# Patient Record
Sex: Female | Born: 1985 | Race: White | Hispanic: No | Marital: Married | Smoking: Never smoker
Health system: Southern US, Community
[De-identification: ages and names within clinical notes are randomized; demographics above are authoritative.]

## PROBLEM LIST (undated history)

## (undated) DIAGNOSIS — R519 Headache, unspecified: Secondary | ICD-10-CM

## (undated) DIAGNOSIS — K589 Irritable bowel syndrome without diarrhea: Secondary | ICD-10-CM

## (undated) DIAGNOSIS — U071 COVID-19: Secondary | ICD-10-CM

## (undated) DIAGNOSIS — F419 Anxiety disorder, unspecified: Secondary | ICD-10-CM

## (undated) DIAGNOSIS — K219 Gastro-esophageal reflux disease without esophagitis: Secondary | ICD-10-CM

## (undated) HISTORY — PX: LAPAROSCOPIC OVARIAN CYSTECTOMY: SUR786

---

## 2007-05-17 ENCOUNTER — Ambulatory Visit (HOSPITAL_COMMUNITY): Admission: RE | Admit: 2007-05-17 | Discharge: 2007-05-17 | Payer: Self-pay | Admitting: Obstetrics and Gynecology

## 2007-05-18 ENCOUNTER — Encounter (INDEPENDENT_AMBULATORY_CARE_PROVIDER_SITE_OTHER): Payer: Self-pay | Admitting: Obstetrics and Gynecology

## 2007-08-02 ENCOUNTER — Encounter: Admission: RE | Admit: 2007-08-02 | Discharge: 2007-08-02 | Payer: Self-pay | Admitting: Family Medicine

## 2009-05-20 ENCOUNTER — Ambulatory Visit (HOSPITAL_COMMUNITY): Admission: RE | Admit: 2009-05-20 | Discharge: 2009-05-20 | Payer: Self-pay | Admitting: Obstetrics and Gynecology

## 2009-10-01 ENCOUNTER — Encounter: Admission: RE | Admit: 2009-10-01 | Discharge: 2009-10-01 | Payer: Self-pay | Admitting: Gastroenterology

## 2011-03-10 NOTE — Op Note (Signed)
NAME:  Megan Sanchez, Megan Sanchez           ACCOUNT NO.:  [id number]   MEDICAL RECORD NO.:  [id number]          PATIENT TYPE:  AMB   LOCATION:  SDC                           FACILITY:  [facility]   PHYSICIAN:  Randye Lobo, M.D.   DATE OF BIRTH:  23-Feb-1986   DATE OF PROCEDURE:  05/17/2007  DATE OF DISCHARGE:                               OPERATIVE REPORT   PREOPERATIVE DIAGNOSIS:  Right ovarian cyst, chronic low back pain.   POSTOPERATIVE DIAGNOSIS:  Right ovarian cyst, chronic low back pain.   PROCEDURE:  The laparoscopic right ovarian cystectomy.   SURGEON:  Conley Simmonds, M.D.   ASSISTANT:  Miguel Aschoff, M.D.   ANESTHESIA:  Is general endotracheal.   IV FLUIDS:  500 mL Ringer's lactate.   ESTIMATED BLOOD LOSS:  Minimal.   URINE OUTPUT:  50 mL   COMPLICATIONS:  None.   INDICATIONS FOR PROCEDURE:  The patient is a 25 year old gravida 31  Caucasian female who presented to Urgent Care for evaluation of low back  pain.  The patient had a CT scan which documented two right ovarian  cysts measuring 2.5 and 2.3 cm in February 2008.  The patient was  subsequently seen in the gynecology office for evaluation and she was  noted to have a tender right adnexal mass consistent with a simple right  ovarian cyst on ultrasound.  The patient was followed over time and had  persistence and increase in size of simple right ovarian cyst, which  measured 3.6 cm at the time of surgery.  The patient had been on oral  contraceptive therapy and had no resolution of the ovarian cyst.  A plan  is made now to proceed with surgical treatment of the right ovarian cyst  and evaluation of the patient's chronic low back pain to rule out  endometriosis or adhesive disease.  Risks, benefits, and alternatives  have been reviewed with the patient who wishes to proceed.   FINDINGS:  Laparoscopy demonstrated a 3 cm serous filled right ovarian  cyst.  The left ovary, bilateral fallopian tubes and uterus were  unremarkable.   The appendix, liver, stomach organ, and a large and small  intestine appeared to be unremarkable.  There was no evidence of any  adhesive disease nor endometriosis in the abdomen or the pelvis.   SPECIMENS:  The right ovarian cyst wall was sent to pathology.   PROCEDURE:  The patient was reidentified in the preoperative hold area.  She did receive Ancef 1 gram IV for antibiotic prophylaxis.  The patient  was brought to the operating room where general endotracheal anesthesia  was induced.  She was placed in the dorsal lithotomy position.  The  abdomen and the vagina were sterilely prepped and a Foley catheter was  sterilely placed inside the bladder.  A Hulka tenaculum was placed on  the anterior cervical lip.   The laparoscopic portion of the procedure began by creating a  curvilinear incision in the umbilicus.  The incision was carried down to  the fascia using a Kelly clamp.  A 10-mm trocar was inserted directly  into the peritoneal cavity and  the laparoscope confirmed proper  placement.  The CO2 pneumoperitoneum was achieved.   The patient was placed in the Trendelenburg position.  A 5 mm incision  was created in each the right and left lower quadrants.  5 mm trocars  were placed under direct visualization of the laparoscope.  An  inspection of the pelvic and abdominal organs was performed and findings  are as noted above.   The procedure began by a using monopolar energy to create a linear  incision over the right ovarian cortex.  During the course of this  dissection the cyst was entered and clear yellow fluid was extruded from  the ovarian cyst.  The ovarian cyst which had been entered was now  dissected off from the surrounding ovarian cortex using a grasper  forceps.  This cyst was actually quite adherent to the ovarian cortex  and had to be removed in pieces.  The cyst was then irrigated and  suctioned and the base of the ovarian cyst was cauterized to ablate any   remaining cyst wall which was very difficult to remove.  Hemostasis was  noted to be good.   Intercede was then placed around the right ovary.  Hemostasis was  confirmed again.   The 5 mm trocars were removed under direct visualization of the  laparoscope.  A 10-mm trocar in the umbilicus and the laparoscope were  removed simultaneously.   The umbilical incision was closed with a subcuticular suture of 4-0  Vicryl.  This was covered by sterile bandage.  The 5-mm incisions were  closed with Dermabond.   The vaginal instruments and a Foley catheter were removed.  This  concluded the patient's procedure.  There were no complications.  All  needle, instrument, sponge counts were correct.      Randye Lobo, M.D.  Electronically Signed     BES/MEDQ  D:  05/17/2007  T:  05/17/2007  Job:  [id number]

## 2011-08-10 LAB — PREGNANCY, URINE: Preg Test, Ur: NEGATIVE

## 2011-08-10 LAB — BASIC METABOLIC PANEL
BUN: 7
CO2: 27
Chloride: 104
Creatinine, Ser: 0.77
GFR calc Af Amer: >60
Glucose, Bld: 91
Potassium: 4.2
Sodium: 136

## 2011-08-10 LAB — CBC
HCT: 41.5
Hemoglobin: 14.2
Platelets: 263

## 2012-10-27 ENCOUNTER — Other Ambulatory Visit: Payer: Self-pay | Admitting: Orthopedic Surgery

## 2012-10-27 DIAGNOSIS — M542 Cervicalgia: Secondary | ICD-10-CM

## 2012-10-28 ENCOUNTER — Ambulatory Visit
Admission: RE | Admit: 2012-10-28 | Discharge: 2012-10-28 | Disposition: A | Discharge status: Home / Self Care | Payer: BC Managed Care – PPO | Source: Ambulatory Visit | Attending: Orthopedic Surgery | Admitting: Orthopedic Surgery

## 2012-10-28 ENCOUNTER — Other Ambulatory Visit: Payer: Self-pay

## 2012-10-28 DIAGNOSIS — M542 Cervicalgia: Secondary | ICD-10-CM

## 2018-02-03 DIAGNOSIS — K589 Irritable bowel syndrome without diarrhea: Secondary | ICD-10-CM | POA: Insufficient documentation

## 2018-12-23 MED FILL — LARIN 21 1-20 TABLET: 1-20 | 42 days supply | Qty: 42 | Fill #0

## 2018-12-26 MED FILL — SPIRONOLACTONE 100 MG TAB: 100 | 30 days supply | Qty: 30 | Fill #0 | Status: TO

## 2019-01-16 MED FILL — SPIRONOLACTONE 100 MG TAB: 100 | 30 days supply | Qty: 30 | Fill #0

## 2019-01-18 MED FILL — CYCLOBENZAPRINE HCL 5 MG TA: 5 | 10 days supply | Qty: 30 | Fill #0

## 2019-01-19 MED FILL — NORETHIND-ETH ESTRAD 1-0.02: 1-20 | 84 days supply | Qty: 84 | Fill #0

## 2019-03-02 MED FILL — SPIRONOLACTONE 100 MG TAB: 100 | 30 days supply | Qty: 30 | Fill #1

## 2019-04-17 MED FILL — SPIRONOLACTONE 100 MG TAB: 100 | 30 days supply | Qty: 30 | Fill #2

## 2019-04-17 MED FILL — NORETHIND-ETH ESTRAD 1-0.02: 1-20 | 84 days supply | Qty: 84 | Fill #1

## 2019-05-04 ENCOUNTER — Encounter (HOSPITAL_COMMUNITY): Payer: Self-pay | Admitting: Emergency Medicine

## 2019-05-04 ENCOUNTER — Other Ambulatory Visit: Payer: Self-pay

## 2019-05-04 ENCOUNTER — Ambulatory Visit (HOSPITAL_COMMUNITY)
Admission: EM | Admit: 2019-05-04 | Discharge: 2019-05-04 | Disposition: A | Discharge status: Home / Self Care | Payer: 59 | Attending: Emergency Medicine | Admitting: Emergency Medicine

## 2019-05-04 DIAGNOSIS — B3731 Acute candidiasis of vulva and vagina: Secondary | ICD-10-CM

## 2019-05-04 DIAGNOSIS — Z3202 Encounter for pregnancy test, result negative: Secondary | ICD-10-CM

## 2019-05-04 DIAGNOSIS — B373 Candidiasis of vulva and vagina: Secondary | ICD-10-CM | POA: Diagnosis not present

## 2019-05-04 LAB — POCT URINALYSIS DIP (DEVICE)
Bilirubin Urine: NEGATIVE
Glucose, UA: NEGATIVE mg/dL
Ketones, ur: NEGATIVE mg/dL
Nitrite: NEGATIVE
Protein, ur: NEGATIVE mg/dL
Specific Gravity, Urine: 1.015 (ref 1.005–1.030)
Urobilinogen, UA: 0.2 mg/dL (ref 0.0–1.0)
pH: 7 (ref 5.0–8.0)

## 2019-05-04 LAB — POCT PREGNANCY, URINE: Preg Test, Ur: NEGATIVE

## 2019-05-04 MED ORDER — FLUCONAZOLE 200 MG PO TABS: 200.0000 mg | ORAL_TABLET | Freq: Once | ORAL | 0 refills | Status: AC

## 2019-05-04 MED FILL — FLUCONAZOLE 200 MG TABLET: 200 | 3 days supply | Qty: 2 | Fill #0

## 2019-05-04 NOTE — Discharge Instructions (Signed)
Take Diflucan today, may repeat in 3 days if still having symptoms. This may take up to 1 week to reach maximum efficacy. Turn if you develop worsening discharge, vaginal or pelvic pain, fever, abdominal pain, difficulty urinating, blood in your urine.
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
## 2019-05-04 NOTE — ED Triage Notes (Signed)
Pt sts dysuria with vaginal irritation; pt sts taking septra without relief

## 2019-05-05 LAB — CERVICOVAGINAL ANCILLARY ONLY
Bacterial vaginitis: NEGATIVE
Chlamydia: NEGATIVE
Neisseria Gonorrhea: NEGATIVE
Trichomonas: NEGATIVE

## 2019-05-05 LAB — URINE CULTURE: Culture: NO GROWTH

## 2019-06-20 ENCOUNTER — Other Ambulatory Visit (HOSPITAL_COMMUNITY)
Admission: RE | Admit: 2019-06-20 | Discharge: 2019-06-20 | Disposition: A | Discharge status: Home / Self Care | Payer: 59 | Source: Ambulatory Visit | Attending: Obstetrics and Gynecology | Admitting: Obstetrics and Gynecology

## 2019-06-20 ENCOUNTER — Other Ambulatory Visit: Payer: Self-pay | Admitting: Obstetrics and Gynecology

## 2019-06-20 DIAGNOSIS — Z309 Encounter for contraceptive management, unspecified: Secondary | ICD-10-CM | POA: Diagnosis not present

## 2019-06-20 DIAGNOSIS — N898 Other specified noninflammatory disorders of vagina: Secondary | ICD-10-CM | POA: Diagnosis not present

## 2019-06-20 DIAGNOSIS — Z124 Encounter for screening for malignant neoplasm of cervix: Secondary | ICD-10-CM | POA: Insufficient documentation

## 2019-06-20 DIAGNOSIS — Z01419 Encounter for gynecological examination (general) (routine) without abnormal findings: Secondary | ICD-10-CM | POA: Diagnosis not present

## 2019-06-20 DIAGNOSIS — N760 Acute vaginitis: Secondary | ICD-10-CM | POA: Diagnosis not present

## 2019-06-20 DIAGNOSIS — L709 Acne, unspecified: Secondary | ICD-10-CM | POA: Diagnosis not present

## 2019-06-20 DIAGNOSIS — Z79899 Other long term (current) drug therapy: Secondary | ICD-10-CM | POA: Diagnosis not present

## 2019-06-20 MED FILL — SPIRONOLACTONE 100 MG TAB: 100 | 30 days supply | Qty: 30 | Fill #0

## 2019-06-20 MED FILL — LARIN FE 1.5-30 TABLET: 1.5-30 | 84 days supply | Qty: 84 | Fill #0

## 2019-06-23 LAB — CYTOLOGY - PAP
HPV 16/18/45 genotyping: NEGATIVE
HPV: DETECTED — AB

## 2019-06-28 ENCOUNTER — Other Ambulatory Visit: Payer: Self-pay | Admitting: Obstetrics and Gynecology

## 2019-06-28 DIAGNOSIS — R87612 Low grade squamous intraepithelial lesion on cytologic smear of cervix (LGSIL): Secondary | ICD-10-CM | POA: Diagnosis not present

## 2019-06-28 DIAGNOSIS — Z0001 Encounter for general adult medical examination with abnormal findings: Secondary | ICD-10-CM | POA: Diagnosis not present

## 2019-06-28 DIAGNOSIS — R87810 Cervical high risk human papillomavirus (HPV) DNA test positive: Secondary | ICD-10-CM | POA: Diagnosis not present

## 2019-06-28 DIAGNOSIS — N870 Mild cervical dysplasia: Secondary | ICD-10-CM | POA: Diagnosis not present

## 2019-06-28 DIAGNOSIS — M542 Cervicalgia: Secondary | ICD-10-CM | POA: Diagnosis not present

## 2019-06-28 DIAGNOSIS — L700 Acne vulgaris: Secondary | ICD-10-CM | POA: Diagnosis not present

## 2019-06-28 DIAGNOSIS — J3489 Other specified disorders of nose and nasal sinuses: Secondary | ICD-10-CM | POA: Diagnosis not present

## 2019-06-28 DIAGNOSIS — Z3202 Encounter for pregnancy test, result negative: Secondary | ICD-10-CM | POA: Diagnosis not present

## 2019-06-28 MED FILL — MUPIROCIN 2% CREAM: 2 | 10 days supply | Qty: 30 | Fill #0

## 2019-06-29 MED FILL — CYCLOBENZAPRINE 10 MG TAB: 10 | 20 days supply | Qty: 60 | Fill #0

## 2019-07-05 DIAGNOSIS — Z8742 Personal history of other diseases of the female genital tract: Secondary | ICD-10-CM | POA: Diagnosis not present

## 2019-07-17 MED FILL — SPIRONOLACTONE 100 MG TAB: 100 | 30 days supply | Qty: 30 | Fill #1

## 2019-08-14 MED FILL — SPIRONOLACTONE 100 MG TAB: 100 | 30 days supply | Qty: 30 | Fill #2

## 2019-09-18 MED FILL — SPIRONOLACTONE 100 MG TAB: 100 | 90 days supply | Qty: 90 | Fill #0

## 2019-09-19 DIAGNOSIS — L710 Perioral dermatitis: Secondary | ICD-10-CM | POA: Diagnosis not present

## 2019-09-19 DIAGNOSIS — D1801 Hemangioma of skin and subcutaneous tissue: Secondary | ICD-10-CM | POA: Diagnosis not present

## 2019-09-19 DIAGNOSIS — L700 Acne vulgaris: Secondary | ICD-10-CM | POA: Diagnosis not present

## 2019-09-19 DIAGNOSIS — D2261 Melanocytic nevi of right upper limb, including shoulder: Secondary | ICD-10-CM | POA: Diagnosis not present

## 2019-11-29 MED FILL — SPIRONOLACTONE 100 MG TAB: 100 | 30 days supply | Qty: 30 | Fill #0

## 2020-01-12 MED FILL — LARIN FE 1.5-30 TABLET: 1.5-30 | 84 days supply | Qty: 84 | Fill #2

## 2020-01-12 MED FILL — spIRONOLACTONE 100 MG TAB: 100 | 30 days supply | Qty: 30 | Fill #1

## 2020-01-12 MED FILL — OLOPATADINE HCL 0.2 % SOLN: 0.2 | 25 days supply | Qty: 3 | Fill #0

## 2020-02-12 MED FILL — OLOPATADINE HCL 0.2 % SOLN: 0.2 | 25 days supply | Qty: 3 | Fill #1

## 2020-02-12 MED FILL — SPIRONOLACTONE 100 MG TAB: 100 | 30 days supply | Qty: 30 | Fill #2

## 2020-03-12 MED FILL — SPIRONOLACTONE 100 MG TAB: 100 | 30 days supply | Qty: 30 | Fill #3

## 2020-04-10 MED FILL — LARIN FE 1.5-30 TABLET: 1.5-30 | 84 days supply | Qty: 84 | Fill #3

## 2020-04-10 MED FILL — SPIRONOLACTONE 100 MG TABS: 100 | 30 days supply | Qty: 30 | Fill #4

## 2020-05-15 MED FILL — SPIRONOLACTONE 100 MG TAB: 100 | 30 days supply | Qty: 30 | Fill #5

## 2020-06-10 MED FILL — SPIRONOLACTONE 100 MG TAB: 100 | 30 days supply | Qty: 30 | Fill #6

## 2020-06-21 ENCOUNTER — Other Ambulatory Visit: Payer: Self-pay | Admitting: Obstetrics and Gynecology

## 2020-06-21 ENCOUNTER — Other Ambulatory Visit (HOSPITAL_COMMUNITY)
Admission: RE | Admit: 2020-06-21 | Discharge: 2020-06-21 | Disposition: A | Discharge status: Home / Self Care | Payer: 59 | Source: Ambulatory Visit | Attending: Obstetrics and Gynecology | Admitting: Obstetrics and Gynecology

## 2020-06-21 ENCOUNTER — Other Ambulatory Visit (HOSPITAL_COMMUNITY): Payer: Self-pay | Admitting: Obstetrics and Gynecology

## 2020-06-21 DIAGNOSIS — Z124 Encounter for screening for malignant neoplasm of cervix: Secondary | ICD-10-CM | POA: Diagnosis not present

## 2020-06-21 DIAGNOSIS — Z309 Encounter for contraceptive management, unspecified: Secondary | ICD-10-CM | POA: Diagnosis not present

## 2020-06-21 DIAGNOSIS — N870 Mild cervical dysplasia: Secondary | ICD-10-CM | POA: Diagnosis not present

## 2020-06-21 DIAGNOSIS — Z01411 Encounter for gynecological examination (general) (routine) with abnormal findings: Secondary | ICD-10-CM | POA: Diagnosis not present

## 2020-06-21 DIAGNOSIS — R87810 Cervical high risk human papillomavirus (HPV) DNA test positive: Secondary | ICD-10-CM | POA: Diagnosis not present

## 2020-06-27 LAB — CYTOLOGY - PAP
Comment: NEGATIVE
Diagnosis: UNDETERMINED — AB
High risk HPV: NEGATIVE

## 2020-07-12 MED FILL — SPIRONOLACTONE 100 MG TAB: 100 | 30 days supply | Qty: 30 | Fill #7

## 2020-07-12 MED FILL — LARIN FE 1.5-30 TABLET: 1.5-30 | 84 days supply | Qty: 84 | Fill #0

## 2020-07-20 DIAGNOSIS — J069 Acute upper respiratory infection, unspecified: Secondary | ICD-10-CM | POA: Diagnosis not present

## 2020-07-20 DIAGNOSIS — Z20822 Contact with and (suspected) exposure to covid-19: Secondary | ICD-10-CM | POA: Diagnosis not present

## 2020-08-07 MED FILL — SPIRONOLACTONE 100 MG TAB: 100 | 30 days supply | Qty: 30 | Fill #8

## 2020-08-08 ENCOUNTER — Other Ambulatory Visit: Payer: Self-pay | Admitting: Physician Assistant

## 2020-08-08 DIAGNOSIS — A09 Infectious gastroenteritis and colitis, unspecified: Secondary | ICD-10-CM | POA: Diagnosis not present

## 2020-08-08 DIAGNOSIS — R1011 Right upper quadrant pain: Secondary | ICD-10-CM | POA: Diagnosis not present

## 2020-08-09 ENCOUNTER — Ambulatory Visit
Admission: RE | Admit: 2020-08-09 | Discharge: 2020-08-09 | Disposition: A | Discharge status: Home / Self Care | Payer: 59 | Source: Ambulatory Visit | Attending: Physician Assistant | Admitting: Physician Assistant

## 2020-08-09 DIAGNOSIS — R1011 Right upper quadrant pain: Secondary | ICD-10-CM

## 2020-08-09 DIAGNOSIS — A09 Infectious gastroenteritis and colitis, unspecified: Secondary | ICD-10-CM | POA: Diagnosis not present

## 2020-08-20 ENCOUNTER — Other Ambulatory Visit (HOSPITAL_COMMUNITY): Payer: Self-pay | Admitting: Physician Assistant

## 2020-08-20 MED FILL — DICYCLOMINE 20 MG TABLET: 20 | 30 days supply | Qty: 120 | Fill #0

## 2020-08-28 DIAGNOSIS — L700 Acne vulgaris: Secondary | ICD-10-CM | POA: Diagnosis not present

## 2020-08-28 DIAGNOSIS — M542 Cervicalgia: Secondary | ICD-10-CM | POA: Diagnosis not present

## 2020-08-28 DIAGNOSIS — K589 Irritable bowel syndrome without diarrhea: Secondary | ICD-10-CM | POA: Diagnosis not present

## 2020-08-28 DIAGNOSIS — Z0000 Encounter for general adult medical examination without abnormal findings: Secondary | ICD-10-CM | POA: Diagnosis not present

## 2020-08-28 DIAGNOSIS — Z13220 Encounter for screening for lipoid disorders: Secondary | ICD-10-CM | POA: Diagnosis not present

## 2020-09-04 MED FILL — SPIRONOLACTONE 100 MG TAB: 100 | 30 days supply | Qty: 30 | Fill #9

## 2020-09-12 ENCOUNTER — Other Ambulatory Visit (HOSPITAL_COMMUNITY): Payer: Self-pay | Admitting: Family Medicine

## 2020-09-12 MED FILL — CYCLOBENZAPRINE HCL 10 MG T: 10 | 20 days supply | Qty: 60 | Fill #0

## 2020-09-24 ENCOUNTER — Other Ambulatory Visit (HOSPITAL_COMMUNITY): Payer: Self-pay | Admitting: Dermatology

## 2020-09-24 DIAGNOSIS — D2262 Melanocytic nevi of left upper limb, including shoulder: Secondary | ICD-10-CM | POA: Diagnosis not present

## 2020-09-24 DIAGNOSIS — L819 Disorder of pigmentation, unspecified: Secondary | ICD-10-CM | POA: Diagnosis not present

## 2020-09-24 DIAGNOSIS — D225 Melanocytic nevi of trunk: Secondary | ICD-10-CM | POA: Diagnosis not present

## 2020-09-24 DIAGNOSIS — D2261 Melanocytic nevi of right upper limb, including shoulder: Secondary | ICD-10-CM | POA: Diagnosis not present

## 2020-09-24 DIAGNOSIS — D1801 Hemangioma of skin and subcutaneous tissue: Secondary | ICD-10-CM | POA: Diagnosis not present

## 2020-09-24 DIAGNOSIS — D2271 Melanocytic nevi of right lower limb, including hip: Secondary | ICD-10-CM | POA: Diagnosis not present

## 2020-09-24 DIAGNOSIS — L700 Acne vulgaris: Secondary | ICD-10-CM | POA: Diagnosis not present

## 2020-09-24 DIAGNOSIS — D2272 Melanocytic nevi of left lower limb, including hip: Secondary | ICD-10-CM | POA: Diagnosis not present

## 2020-10-08 ENCOUNTER — Other Ambulatory Visit (HOSPITAL_COMMUNITY): Payer: Self-pay | Admitting: Physician Assistant

## 2020-10-08 DIAGNOSIS — H6503 Acute serous otitis media, bilateral: Secondary | ICD-10-CM | POA: Insufficient documentation

## 2020-10-08 DIAGNOSIS — J310 Chronic rhinitis: Secondary | ICD-10-CM | POA: Insufficient documentation

## 2020-10-08 DIAGNOSIS — R1011 Right upper quadrant pain: Secondary | ICD-10-CM | POA: Diagnosis not present

## 2020-10-08 DIAGNOSIS — K589 Irritable bowel syndrome without diarrhea: Secondary | ICD-10-CM | POA: Diagnosis not present

## 2020-10-08 DIAGNOSIS — H6983 Other specified disorders of Eustachian tube, bilateral: Secondary | ICD-10-CM | POA: Diagnosis not present

## 2020-10-08 DIAGNOSIS — R110 Nausea: Secondary | ICD-10-CM | POA: Diagnosis not present

## 2020-10-08 DIAGNOSIS — H6993 Unspecified Eustachian tube disorder, bilateral: Secondary | ICD-10-CM | POA: Insufficient documentation

## 2020-10-08 MED FILL — FLUTICASONE PROP 50 MCG SPR: 50 | 30 days supply | Qty: 16 | Fill #0

## 2020-10-08 MED FILL — SPIRONOLACTONE 100 MG TAB: 100 | 30 days supply | Qty: 30 | Fill #0

## 2020-10-08 MED FILL — LARIN FE 1.5-30 TABLET: 1.5-30 | 84 days supply | Qty: 84 | Fill #1

## 2020-10-08 MED FILL — ONDANSETRON HCL 4 MG TABLET: 4 | 14 days supply | Qty: 42 | Fill #0

## 2020-10-09 ENCOUNTER — Other Ambulatory Visit (HOSPITAL_COMMUNITY): Payer: Self-pay | Admitting: Family Medicine

## 2020-10-09 MED FILL — FLUCONAZOLE 150 MG TABS: 150 | 1 days supply | Qty: 1 | Fill #0

## 2020-11-03 DIAGNOSIS — R0981 Nasal congestion: Secondary | ICD-10-CM | POA: Diagnosis not present

## 2020-11-03 DIAGNOSIS — Z20822 Contact with and (suspected) exposure to covid-19: Secondary | ICD-10-CM | POA: Diagnosis not present

## 2020-11-03 DIAGNOSIS — J40 Bronchitis, not specified as acute or chronic: Secondary | ICD-10-CM | POA: Diagnosis not present

## 2020-11-03 DIAGNOSIS — R059 Cough, unspecified: Secondary | ICD-10-CM | POA: Diagnosis not present

## 2020-11-08 MED FILL — CYCLOBENZAPRINE HCL 10 MG T: 10 | 20 days supply | Qty: 60 | Fill #1

## 2020-11-08 MED FILL — ONDANSETRON HCL 4 MG TABLET: 4 | 14 days supply | Qty: 42 | Fill #1

## 2020-11-08 MED FILL — FLUTICASONE PROP 50 MCG SPR: 50 | 30 days supply | Qty: 16 | Fill #1

## 2020-11-08 MED FILL — spIRONOLACTONE 100 MG TAB: 100 | 30 days supply | Qty: 30 | Fill #1

## 2020-12-05 MED FILL — spIRONOLACTONE 100 MG TAB: 100 | 30 days supply | Qty: 30 | Fill #2

## 2020-12-18 ENCOUNTER — Other Ambulatory Visit: Payer: Self-pay | Admitting: Gastroenterology

## 2020-12-18 ENCOUNTER — Other Ambulatory Visit (HOSPITAL_COMMUNITY): Payer: Self-pay | Admitting: Gastroenterology

## 2020-12-18 DIAGNOSIS — R1011 Right upper quadrant pain: Secondary | ICD-10-CM | POA: Diagnosis not present

## 2020-12-18 DIAGNOSIS — R197 Diarrhea, unspecified: Secondary | ICD-10-CM | POA: Diagnosis not present

## 2020-12-18 DIAGNOSIS — R112 Nausea with vomiting, unspecified: Secondary | ICD-10-CM | POA: Diagnosis not present

## 2020-12-18 DIAGNOSIS — K580 Irritable bowel syndrome with diarrhea: Secondary | ICD-10-CM | POA: Diagnosis not present

## 2021-01-02 MED FILL — LARIN FE 1.5-30 TABLET: 1.5-30 | 84 days supply | Qty: 84 | Fill #2

## 2021-01-02 MED FILL — SPIRONOLACTONE 100 MG TAB: 100 | 30 days supply | Qty: 30 | Fill #3

## 2021-01-07 ENCOUNTER — Other Ambulatory Visit (HOSPITAL_COMMUNITY): Payer: Self-pay | Admitting: Physician Assistant

## 2021-01-07 DIAGNOSIS — R519 Headache, unspecified: Secondary | ICD-10-CM | POA: Diagnosis not present

## 2021-01-07 DIAGNOSIS — F419 Anxiety disorder, unspecified: Secondary | ICD-10-CM | POA: Diagnosis not present

## 2021-01-09 ENCOUNTER — Other Ambulatory Visit: Payer: Self-pay

## 2021-01-09 ENCOUNTER — Ambulatory Visit (HOSPITAL_COMMUNITY)
Admission: RE | Admit: 2021-01-09 | Discharge: 2021-01-09 | Disposition: A | Discharge status: Home / Self Care | Payer: 59 | Source: Ambulatory Visit | Attending: Gastroenterology | Admitting: Gastroenterology

## 2021-01-09 DIAGNOSIS — R109 Unspecified abdominal pain: Secondary | ICD-10-CM | POA: Diagnosis not present

## 2021-01-09 DIAGNOSIS — R1011 Right upper quadrant pain: Secondary | ICD-10-CM | POA: Insufficient documentation

## 2021-01-09 MED ORDER — TECHNETIUM TC 99M MEBROFENIN IV KIT
5.2000 | PACK | Freq: Once | INTRAVENOUS | Status: AC | PRN
  Administered 2021-01-09: 5.2 via INTRAVENOUS

## 2021-01-13 DIAGNOSIS — E663 Overweight: Secondary | ICD-10-CM | POA: Diagnosis not present

## 2021-01-13 DIAGNOSIS — L658 Other specified nonscarring hair loss: Secondary | ICD-10-CM | POA: Diagnosis not present

## 2021-01-13 DIAGNOSIS — R5382 Chronic fatigue, unspecified: Secondary | ICD-10-CM | POA: Diagnosis not present

## 2021-01-13 DIAGNOSIS — N926 Irregular menstruation, unspecified: Secondary | ICD-10-CM | POA: Diagnosis not present

## 2021-01-13 DIAGNOSIS — E6609 Other obesity due to excess calories: Secondary | ICD-10-CM | POA: Diagnosis not present

## 2021-01-28 ENCOUNTER — Other Ambulatory Visit (HOSPITAL_COMMUNITY): Payer: Self-pay

## 2021-01-28 MED ORDER — PEG 3350-KCL-NA BICARB-NACL 420 G PO SOLR
4000.0000 mL | Freq: Once | ORAL | 0 refills | Status: AC
  Filled 2021-01-28: qty 4000, 1d supply, fill #0

## 2021-01-29 ENCOUNTER — Other Ambulatory Visit (HOSPITAL_COMMUNITY): Payer: Self-pay

## 2021-02-05 ENCOUNTER — Other Ambulatory Visit (HOSPITAL_COMMUNITY): Payer: Self-pay

## 2021-02-05 DIAGNOSIS — L292 Pruritus vulvae: Secondary | ICD-10-CM | POA: Diagnosis not present

## 2021-02-05 DIAGNOSIS — R3989 Other symptoms and signs involving the genitourinary system: Secondary | ICD-10-CM | POA: Diagnosis not present

## 2021-02-05 MED ORDER — FLUCONAZOLE 150 MG PO TABS
150.0000 mg | ORAL_TABLET | Freq: Every day | ORAL | 0 refills | Status: DC
  Filled 2021-02-05: qty 1, 1d supply, fill #0

## 2021-02-05 MED FILL — Cyclobenzaprine HCl Tab 10 MG: ORAL | 20 days supply | Qty: 60 | Fill #0 | Status: AC

## 2021-02-05 MED FILL — Spironolactone Tab 100 MG: ORAL | 30 days supply | Qty: 30 | Fill #0 | Status: AC

## 2021-02-11 ENCOUNTER — Other Ambulatory Visit (HOSPITAL_COMMUNITY): Payer: Self-pay

## 2021-02-11 MED ORDER — FLUCONAZOLE 150 MG PO TABS
150.0000 mg | ORAL_TABLET | ORAL | 0 refills | Status: DC
  Filled 2021-02-11: qty 1, 1d supply, fill #0

## 2021-03-04 DIAGNOSIS — R350 Frequency of micturition: Secondary | ICD-10-CM | POA: Diagnosis not present

## 2021-03-04 DIAGNOSIS — N898 Other specified noninflammatory disorders of vagina: Secondary | ICD-10-CM | POA: Diagnosis not present

## 2021-03-25 DIAGNOSIS — R197 Diarrhea, unspecified: Secondary | ICD-10-CM | POA: Diagnosis not present

## 2021-03-25 DIAGNOSIS — D128 Benign neoplasm of rectum: Secondary | ICD-10-CM | POA: Diagnosis not present

## 2021-03-25 DIAGNOSIS — K5289 Other specified noninfective gastroenteritis and colitis: Secondary | ICD-10-CM | POA: Diagnosis not present

## 2021-03-25 DIAGNOSIS — K640 First degree hemorrhoids: Secondary | ICD-10-CM | POA: Diagnosis not present

## 2021-03-27 ENCOUNTER — Other Ambulatory Visit (HOSPITAL_COMMUNITY): Payer: Self-pay

## 2021-03-27 MED FILL — Spironolactone Tab 100 MG: ORAL | 30 days supply | Qty: 30 | Fill #1 | Status: AC

## 2021-03-27 MED FILL — Norethindrone Ace & Ethinyl Estradiol-FE Tab 1.5 MG-30 MCG: ORAL | 84 days supply | Qty: 84 | Fill #0 | Status: AC

## 2021-04-23 ENCOUNTER — Other Ambulatory Visit (HOSPITAL_COMMUNITY): Payer: Self-pay

## 2021-04-23 DIAGNOSIS — G43909 Migraine, unspecified, not intractable, without status migrainosus: Secondary | ICD-10-CM | POA: Insufficient documentation

## 2021-04-23 DIAGNOSIS — F419 Anxiety disorder, unspecified: Secondary | ICD-10-CM | POA: Insufficient documentation

## 2021-04-23 DIAGNOSIS — R519 Headache, unspecified: Secondary | ICD-10-CM | POA: Insufficient documentation

## 2021-04-23 MED ORDER — SULFAMETHOXAZOLE-TRIMETHOPRIM 800-160 MG PO TABS
1.0000 | ORAL_TABLET | Freq: Two times a day (BID) | ORAL | 0 refills | Status: DC
  Filled 2021-04-23: qty 20, 10d supply, fill #0

## 2021-05-14 ENCOUNTER — Other Ambulatory Visit (HOSPITAL_COMMUNITY): Payer: Self-pay

## 2021-05-14 MED FILL — Spironolactone Tab 100 MG: ORAL | 11 days supply | Qty: 11 | Fill #2 | Status: AC

## 2021-05-14 MED FILL — Spironolactone Tab 100 MG: ORAL | 19 days supply | Qty: 19 | Fill #2 | Status: AC

## 2021-06-12 ENCOUNTER — Other Ambulatory Visit (HOSPITAL_COMMUNITY): Payer: Self-pay

## 2021-06-12 MED FILL — Norethindrone Ace & Ethinyl Estradiol-FE Tab 1.5 MG-30 MCG: ORAL | 84 days supply | Qty: 84 | Fill #1 | Status: AC

## 2021-06-12 MED FILL — Spironolactone Tab 100 MG: ORAL | 30 days supply | Qty: 30 | Fill #3 | Status: AC

## 2021-07-21 ENCOUNTER — Other Ambulatory Visit (HOSPITAL_COMMUNITY): Payer: Self-pay

## 2021-07-21 MED FILL — Spironolactone Tab 100 MG: ORAL | 30 days supply | Qty: 30 | Fill #4 | Status: AC

## 2021-07-22 ENCOUNTER — Other Ambulatory Visit (HOSPITAL_COMMUNITY): Payer: Self-pay

## 2021-07-22 DIAGNOSIS — Z01419 Encounter for gynecological examination (general) (routine) without abnormal findings: Secondary | ICD-10-CM | POA: Diagnosis not present

## 2021-07-22 DIAGNOSIS — Z124 Encounter for screening for malignant neoplasm of cervix: Secondary | ICD-10-CM | POA: Diagnosis not present

## 2021-07-22 DIAGNOSIS — Z309 Encounter for contraceptive management, unspecified: Secondary | ICD-10-CM | POA: Diagnosis not present

## 2021-07-22 DIAGNOSIS — D219 Benign neoplasm of connective and other soft tissue, unspecified: Secondary | ICD-10-CM | POA: Diagnosis not present

## 2021-07-22 MED ORDER — NORETHIN ACE-ETH ESTRAD-FE 1.5-30 MG-MCG PO TABS
1.0000 | ORAL_TABLET | Freq: Every day | ORAL | 4 refills | Status: DC
  Filled 2021-07-22 – 2021-09-23 (×4): qty 84, 84d supply, fill #0
  Filled 2021-12-17: qty 84, 84d supply, fill #1
  Filled 2022-03-10: qty 84, 84d supply, fill #2
  Filled 2022-05-29: qty 84, 84d supply, fill #3
  Filled 2022-07-20 – 2022-07-22 (×2): qty 84, 84d supply, fill #4

## 2021-07-24 ENCOUNTER — Ambulatory Visit (HOSPITAL_COMMUNITY)
Admission: EM | Admit: 2021-07-24 | Discharge: 2021-07-24 | Disposition: A | Discharge status: Home / Self Care | Payer: 59 | Attending: Student | Admitting: Student

## 2021-07-24 ENCOUNTER — Other Ambulatory Visit (HOSPITAL_COMMUNITY): Payer: Self-pay

## 2021-07-24 ENCOUNTER — Encounter (HOSPITAL_COMMUNITY): Payer: Self-pay | Admitting: Emergency Medicine

## 2021-07-24 ENCOUNTER — Other Ambulatory Visit: Payer: Self-pay

## 2021-07-24 DIAGNOSIS — S161XXA Strain of muscle, fascia and tendon at neck level, initial encounter: Secondary | ICD-10-CM | POA: Diagnosis not present

## 2021-07-24 MED ORDER — PREDNISONE 20 MG PO TABS
40.0000 mg | ORAL_TABLET | Freq: Every day | ORAL | 0 refills | Status: AC
  Filled 2021-07-24: qty 10, 5d supply, fill #0

## 2021-07-24 MED ORDER — CYCLOBENZAPRINE HCL 10 MG PO TABS
10.0000 mg | ORAL_TABLET | Freq: Three times a day (TID) | ORAL | 0 refills | Status: DC | PRN
  Filled 2021-07-24: qty 30, 10d supply, fill #0

## 2021-07-24 NOTE — Discharge Instructions (Addendum)
-  Start the muscle relaxer- Flexeril, up to 3 times daily for muscle spasms and pain.  This can make you drowsy, so take at bedtime or when you do not need to drive or operate machinery. -Prednisone, 2 pills taken at the same time for 5 days in a row.  Try taking this earlier in the day as it can give you energy. Avoid NSAIDs like ibuprofen and alleve while taking this medication as they can increase your risk of stomach upset and even GI bleeding when in combination with a steroid. You can continue tylenol (acetaminophen) up to 1000mg  3x daily.
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
## 2021-07-28 ENCOUNTER — Other Ambulatory Visit (HOSPITAL_COMMUNITY): Payer: Self-pay

## 2021-07-28 DIAGNOSIS — D259 Leiomyoma of uterus, unspecified: Secondary | ICD-10-CM | POA: Diagnosis not present

## 2021-07-30 DIAGNOSIS — R5382 Chronic fatigue, unspecified: Secondary | ICD-10-CM | POA: Diagnosis not present

## 2021-07-30 DIAGNOSIS — N926 Irregular menstruation, unspecified: Secondary | ICD-10-CM | POA: Diagnosis not present

## 2021-07-30 DIAGNOSIS — E6609 Other obesity due to excess calories: Secondary | ICD-10-CM | POA: Diagnosis not present

## 2021-07-30 DIAGNOSIS — L658 Other specified nonscarring hair loss: Secondary | ICD-10-CM | POA: Diagnosis not present

## 2021-07-30 DIAGNOSIS — E663 Overweight: Secondary | ICD-10-CM | POA: Diagnosis not present

## 2021-08-04 ENCOUNTER — Other Ambulatory Visit (HOSPITAL_COMMUNITY): Payer: Self-pay

## 2021-08-04 ENCOUNTER — Other Ambulatory Visit: Payer: Self-pay

## 2021-08-05 ENCOUNTER — Other Ambulatory Visit (HOSPITAL_COMMUNITY): Payer: Self-pay

## 2021-08-05 ENCOUNTER — Other Ambulatory Visit: Payer: Self-pay

## 2021-08-07 ENCOUNTER — Other Ambulatory Visit (HOSPITAL_COMMUNITY): Payer: Self-pay

## 2021-08-21 ENCOUNTER — Other Ambulatory Visit (HOSPITAL_COMMUNITY): Payer: Self-pay

## 2021-08-21 MED FILL — Spironolactone Tab 100 MG: ORAL | 30 days supply | Qty: 30 | Fill #5 | Status: AC

## 2021-09-02 ENCOUNTER — Other Ambulatory Visit (HOSPITAL_COMMUNITY): Payer: Self-pay

## 2021-09-02 DIAGNOSIS — M542 Cervicalgia: Secondary | ICD-10-CM | POA: Diagnosis not present

## 2021-09-02 DIAGNOSIS — Z23 Encounter for immunization: Secondary | ICD-10-CM | POA: Diagnosis not present

## 2021-09-02 DIAGNOSIS — R519 Headache, unspecified: Secondary | ICD-10-CM | POA: Diagnosis not present

## 2021-09-02 DIAGNOSIS — Z0000 Encounter for general adult medical examination without abnormal findings: Secondary | ICD-10-CM | POA: Diagnosis not present

## 2021-09-02 MED ORDER — EMGALITY 120 MG/ML ~~LOC~~ SOAJ
SUBCUTANEOUS | 12 refills | Status: DC
  Filled 2021-09-02: qty 1, 28d supply, fill #0
  Filled 2021-10-28: qty 1, 30d supply, fill #0
  Filled 2021-12-24: qty 1, 30d supply, fill #1
  Filled 2022-01-26: qty 1, 30d supply, fill #2
  Filled 2022-02-26 – 2022-04-30 (×3): qty 1, 30d supply, fill #3

## 2021-09-02 MED ORDER — CYCLOBENZAPRINE HCL 10 MG PO TABS
10.0000 mg | ORAL_TABLET | Freq: Three times a day (TID) | ORAL | 1 refills | Status: DC | PRN
  Filled 2021-09-02: qty 60, 20d supply, fill #0
  Filled 2021-10-28: qty 60, 20d supply, fill #1

## 2021-09-04 DIAGNOSIS — Z0000 Encounter for general adult medical examination without abnormal findings: Secondary | ICD-10-CM | POA: Diagnosis not present

## 2021-09-04 DIAGNOSIS — Z13220 Encounter for screening for lipoid disorders: Secondary | ICD-10-CM | POA: Diagnosis not present

## 2021-09-04 DIAGNOSIS — Z131 Encounter for screening for diabetes mellitus: Secondary | ICD-10-CM | POA: Diagnosis not present

## 2021-09-23 ENCOUNTER — Other Ambulatory Visit (HOSPITAL_COMMUNITY): Payer: Self-pay

## 2021-09-23 MED FILL — Spironolactone Tab 100 MG: ORAL | 30 days supply | Qty: 30 | Fill #6 | Status: AC

## 2021-09-23 MED FILL — Fluticasone Propionate Nasal Susp 50 MCG/ACT: NASAL | 30 days supply | Qty: 16 | Fill #0 | Status: AC

## 2021-09-25 ENCOUNTER — Other Ambulatory Visit (HOSPITAL_COMMUNITY): Payer: Self-pay

## 2021-09-25 DIAGNOSIS — L720 Epidermal cyst: Secondary | ICD-10-CM | POA: Diagnosis not present

## 2021-09-25 DIAGNOSIS — D225 Melanocytic nevi of trunk: Secondary | ICD-10-CM | POA: Diagnosis not present

## 2021-09-25 DIAGNOSIS — L821 Other seborrheic keratosis: Secondary | ICD-10-CM | POA: Diagnosis not present

## 2021-09-25 DIAGNOSIS — L700 Acne vulgaris: Secondary | ICD-10-CM | POA: Diagnosis not present

## 2021-09-25 DIAGNOSIS — D2272 Melanocytic nevi of left lower limb, including hip: Secondary | ICD-10-CM | POA: Diagnosis not present

## 2021-09-25 DIAGNOSIS — D2372 Other benign neoplasm of skin of left lower limb, including hip: Secondary | ICD-10-CM | POA: Diagnosis not present

## 2021-09-25 DIAGNOSIS — D485 Neoplasm of uncertain behavior of skin: Secondary | ICD-10-CM | POA: Diagnosis not present

## 2021-09-25 DIAGNOSIS — L819 Disorder of pigmentation, unspecified: Secondary | ICD-10-CM | POA: Diagnosis not present

## 2021-09-25 DIAGNOSIS — L565 Disseminated superficial actinic porokeratosis (DSAP): Secondary | ICD-10-CM | POA: Diagnosis not present

## 2021-09-25 MED ORDER — SULFACETAMIDE SODIUM-SULFUR 8-4 % EX SUSP
CUTANEOUS | 3 refills | Status: DC
  Filled 2021-09-25: qty 473, 30d supply, fill #0

## 2021-09-25 MED ORDER — SPIRONOLACTONE 100 MG PO TABS
100.0000 mg | ORAL_TABLET | Freq: Every day | ORAL | 3 refills | Status: DC
  Filled 2021-09-25 – 2021-10-28 (×2): qty 90, 90d supply, fill #0
  Filled 2022-01-26: qty 90, 90d supply, fill #1
  Filled 2022-04-21: qty 90, 90d supply, fill #2
  Filled 2022-07-20: qty 90, 90d supply, fill #3

## 2021-09-26 ENCOUNTER — Other Ambulatory Visit (HOSPITAL_COMMUNITY): Payer: Self-pay

## 2021-09-26 MED ORDER — SULFACETAMIDE SODIUM-SULFUR 10-5 % EX LIQD
CUTANEOUS | 3 refills | Status: DC
  Filled 2021-09-26: qty 340, 30d supply, fill #0

## 2021-09-30 ENCOUNTER — Other Ambulatory Visit (HOSPITAL_COMMUNITY): Payer: Self-pay

## 2021-09-30 MED ORDER — BENZONATATE 200 MG PO CAPS
ORAL_CAPSULE | ORAL | 0 refills | Status: DC
  Filled 2021-09-30: qty 30, 10d supply, fill #0

## 2021-09-30 MED ORDER — DOXYCYCLINE MONOHYDRATE 100 MG PO TABS
ORAL_TABLET | ORAL | 0 refills | Status: DC
  Filled 2021-09-30: qty 14, 7d supply, fill #0

## 2021-10-02 ENCOUNTER — Other Ambulatory Visit (HOSPITAL_COMMUNITY): Payer: Self-pay

## 2021-10-02 MED ORDER — FLUCONAZOLE 150 MG PO TABS
150.0000 mg | ORAL_TABLET | ORAL | 0 refills | Status: DC
  Filled 2021-10-02: qty 2, 3d supply, fill #0

## 2021-10-06 ENCOUNTER — Other Ambulatory Visit (HOSPITAL_COMMUNITY): Payer: Self-pay

## 2021-10-07 ENCOUNTER — Other Ambulatory Visit (HOSPITAL_COMMUNITY): Payer: Self-pay

## 2021-10-13 ENCOUNTER — Other Ambulatory Visit (HOSPITAL_COMMUNITY): Payer: Self-pay

## 2021-10-13 MED ORDER — HYDROCORT-PRAMOXINE (PERIANAL) 2.5-1 % EX CREA
1.0000 "application " | TOPICAL_CREAM | Freq: Three times a day (TID) | CUTANEOUS | 3 refills | Status: AC
  Filled 2021-10-13: qty 30, 14d supply, fill #0

## 2021-10-14 ENCOUNTER — Other Ambulatory Visit (HOSPITAL_COMMUNITY): Payer: Self-pay

## 2021-10-15 ENCOUNTER — Other Ambulatory Visit (HOSPITAL_COMMUNITY): Payer: Self-pay

## 2021-10-15 MED ORDER — SULFACETAMIDE SODIUM-SULFUR 10-5 % EX LIQD
1.0000 "application " | Freq: Every day | CUTANEOUS | 3 refills | Status: DC
  Filled 2021-10-15 – 2021-10-28 (×2): qty 340, 30d supply, fill #0
  Filled 2021-12-17: qty 340, 30d supply, fill #1
  Filled 2022-01-26: qty 340, 30d supply, fill #2
  Filled 2022-02-26: qty 340, 30d supply, fill #3

## 2021-10-16 ENCOUNTER — Other Ambulatory Visit (HOSPITAL_COMMUNITY): Payer: Self-pay

## 2021-10-16 MED ORDER — HYDROCORTISONE (PERIANAL) 2.5 % EX CREA
1.0000 "application " | TOPICAL_CREAM | Freq: Three times a day (TID) | CUTANEOUS | 2 refills | Status: AC | PRN
  Filled 2021-10-16: qty 30, 7d supply, fill #0
  Filled 2022-03-19: qty 30, 7d supply, fill #1
  Filled 2022-09-29: qty 30, 7d supply, fill #2

## 2021-10-28 ENCOUNTER — Other Ambulatory Visit (HOSPITAL_COMMUNITY): Payer: Self-pay

## 2021-11-04 ENCOUNTER — Other Ambulatory Visit (HOSPITAL_COMMUNITY): Payer: Self-pay

## 2021-11-04 DIAGNOSIS — R519 Headache, unspecified: Secondary | ICD-10-CM | POA: Diagnosis not present

## 2021-11-04 MED ORDER — ELETRIPTAN HYDROBROMIDE 40 MG PO TABS
40.0000 mg | ORAL_TABLET | ORAL | 0 refills | Status: DC
  Filled 2021-11-04 – 2021-11-06 (×2): qty 6, 30d supply, fill #0

## 2021-11-04 MED ORDER — TOPIRAMATE 25 MG PO TABS
ORAL_TABLET | ORAL | 0 refills | Status: DC
  Filled 2021-11-04: qty 70, 28d supply, fill #0

## 2021-11-04 MED ORDER — TOPIRAMATE 100 MG PO TABS
100.0000 mg | ORAL_TABLET | Freq: Every day | ORAL | 3 refills | Status: DC
  Filled 2021-11-04: qty 90, 90d supply, fill #0

## 2021-11-06 ENCOUNTER — Other Ambulatory Visit (HOSPITAL_COMMUNITY): Payer: Self-pay

## 2021-11-06 MED ORDER — CARESTART COVID-19 HOME TEST VI KIT
PACK | 0 refills | Status: DC
  Filled 2021-11-06: qty 4, 4d supply, fill #0

## 2021-11-07 ENCOUNTER — Other Ambulatory Visit (HOSPITAL_COMMUNITY): Payer: Self-pay

## 2021-11-07 MED ORDER — RIZATRIPTAN BENZOATE 10 MG PO TABS
10.0000 mg | ORAL_TABLET | ORAL | 2 refills | Status: DC
  Filled 2021-11-07: qty 9, 30d supply, fill #0

## 2021-11-12 ENCOUNTER — Other Ambulatory Visit (HOSPITAL_COMMUNITY): Payer: Self-pay

## 2021-11-12 DIAGNOSIS — Z20822 Contact with and (suspected) exposure to covid-19: Secondary | ICD-10-CM | POA: Diagnosis not present

## 2021-11-12 DIAGNOSIS — R059 Cough, unspecified: Secondary | ICD-10-CM | POA: Diagnosis not present

## 2021-11-12 DIAGNOSIS — J029 Acute pharyngitis, unspecified: Secondary | ICD-10-CM | POA: Diagnosis not present

## 2021-11-12 MED ORDER — LIDOCAINE VISCOUS HCL 2 % MT SOLN
5.0000 mL | OROMUCOSAL | 0 refills | Status: DC | PRN
Start: 2021-11-12 — End: 2022-11-11
  Filled 2021-11-12: qty 120, 2d supply, fill #0

## 2021-11-12 MED ORDER — FLUCONAZOLE 150 MG PO TABS
150.0000 mg | ORAL_TABLET | Freq: Once | ORAL | 0 refills | Status: AC
  Filled 2021-11-12: qty 1, 1d supply, fill #0

## 2021-11-12 MED ORDER — DOXYCYCLINE HYCLATE 100 MG PO TABS
100.0000 mg | ORAL_TABLET | Freq: Two times a day (BID) | ORAL | 0 refills | Status: DC
  Filled 2021-11-12: qty 10, 5d supply, fill #0

## 2021-11-12 MED ORDER — PROMETHAZINE-CODEINE 6.25-10 MG/5ML PO SYRP
5.0000 mL | ORAL_SOLUTION | Freq: Every day | ORAL | 0 refills | Status: DC
  Filled 2021-11-12: qty 100, 20d supply, fill #0

## 2021-11-19 ENCOUNTER — Other Ambulatory Visit (HOSPITAL_COMMUNITY): Payer: Self-pay

## 2021-11-19 DIAGNOSIS — R059 Cough, unspecified: Secondary | ICD-10-CM | POA: Diagnosis not present

## 2021-11-19 DIAGNOSIS — J040 Acute laryngitis: Secondary | ICD-10-CM | POA: Diagnosis not present

## 2021-11-19 DIAGNOSIS — J988 Other specified respiratory disorders: Secondary | ICD-10-CM | POA: Diagnosis not present

## 2021-11-19 MED ORDER — PROMETHAZINE-CODEINE 6.25-10 MG/5ML PO SYRP
5.0000 mL | ORAL_SOLUTION | Freq: Every day | ORAL | 0 refills | Status: AC
  Filled 2021-11-19: qty 25, 5d supply, fill #0

## 2021-11-19 MED ORDER — SULFAMETHOXAZOLE-TRIMETHOPRIM 800-160 MG PO TABS
1.0000 | ORAL_TABLET | Freq: Two times a day (BID) | ORAL | 0 refills | Status: AC
  Filled 2021-11-19: qty 20, 10d supply, fill #0

## 2021-11-19 MED ORDER — PREDNISONE 20 MG PO TABS
ORAL_TABLET | ORAL | 0 refills | Status: AC
  Filled 2021-11-19: qty 12, 6d supply, fill #0

## 2021-11-20 ENCOUNTER — Other Ambulatory Visit (HOSPITAL_COMMUNITY): Payer: Self-pay

## 2021-11-20 MED ORDER — FLUCONAZOLE 150 MG PO TABS
150.0000 mg | ORAL_TABLET | ORAL | 0 refills | Status: DC
  Filled 2021-11-20: qty 2, 5d supply, fill #0

## 2021-11-26 DIAGNOSIS — R000 Tachycardia, unspecified: Secondary | ICD-10-CM | POA: Diagnosis not present

## 2021-11-26 DIAGNOSIS — T887XXA Unspecified adverse effect of drug or medicament, initial encounter: Secondary | ICD-10-CM | POA: Diagnosis not present

## 2021-11-26 DIAGNOSIS — R519 Headache, unspecified: Secondary | ICD-10-CM | POA: Diagnosis not present

## 2021-12-17 ENCOUNTER — Other Ambulatory Visit (HOSPITAL_COMMUNITY): Payer: Self-pay

## 2021-12-17 MED ORDER — CARESTART COVID-19 HOME TEST VI KIT
PACK | 0 refills | Status: DC
  Filled 2021-12-17: qty 4, 4d supply, fill #0

## 2021-12-24 ENCOUNTER — Other Ambulatory Visit (HOSPITAL_COMMUNITY): Payer: Self-pay

## 2022-01-22 DIAGNOSIS — F331 Major depressive disorder, recurrent, moderate: Secondary | ICD-10-CM | POA: Diagnosis not present

## 2022-01-22 DIAGNOSIS — F411 Generalized anxiety disorder: Secondary | ICD-10-CM | POA: Diagnosis not present

## 2022-01-26 ENCOUNTER — Other Ambulatory Visit (HOSPITAL_COMMUNITY): Payer: Self-pay

## 2022-02-18 DIAGNOSIS — F411 Generalized anxiety disorder: Secondary | ICD-10-CM | POA: Diagnosis not present

## 2022-02-18 DIAGNOSIS — F331 Major depressive disorder, recurrent, moderate: Secondary | ICD-10-CM | POA: Diagnosis not present

## 2022-02-25 DIAGNOSIS — F411 Generalized anxiety disorder: Secondary | ICD-10-CM | POA: Diagnosis not present

## 2022-02-25 DIAGNOSIS — F331 Major depressive disorder, recurrent, moderate: Secondary | ICD-10-CM | POA: Diagnosis not present

## 2022-02-26 ENCOUNTER — Other Ambulatory Visit (HOSPITAL_COMMUNITY): Payer: Self-pay

## 2022-02-27 ENCOUNTER — Other Ambulatory Visit (HOSPITAL_COMMUNITY): Payer: Self-pay

## 2022-03-10 ENCOUNTER — Other Ambulatory Visit (HOSPITAL_COMMUNITY): Payer: Self-pay

## 2022-03-11 DIAGNOSIS — F331 Major depressive disorder, recurrent, moderate: Secondary | ICD-10-CM | POA: Diagnosis not present

## 2022-03-11 DIAGNOSIS — F411 Generalized anxiety disorder: Secondary | ICD-10-CM | POA: Diagnosis not present

## 2022-03-19 ENCOUNTER — Other Ambulatory Visit (HOSPITAL_COMMUNITY): Payer: Self-pay

## 2022-03-20 IMAGING — US US ABDOMEN LIMITED
1 series · 14 of 25 positions shown · non-contrast
Comparison: None.

CLINICAL DATA: Right upper quadrant abdominal pain. Severe diarrhea
after eating. Symptoms for 3 weeks.

EXAM:
ULTRASOUND ABDOMEN LIMITED RIGHT UPPER QUADRANT

[Series 1: us abdomen limited · 0.14mm/px · 14 of 44 slices shown]
[im 1/44]
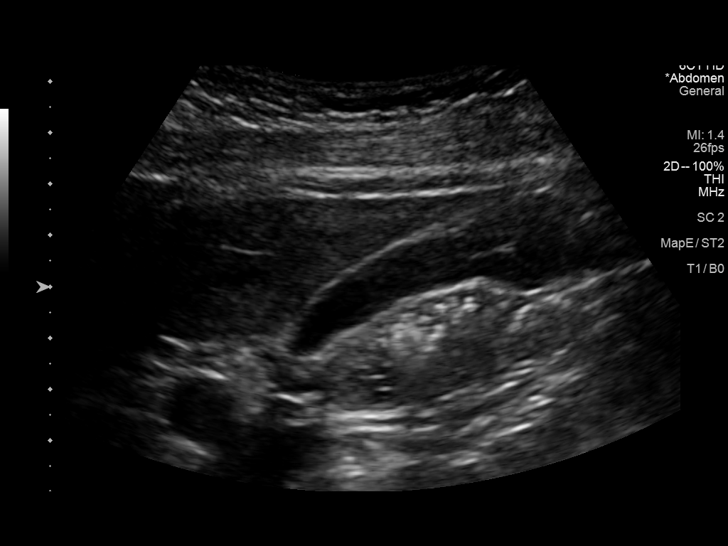
[im 4/44]
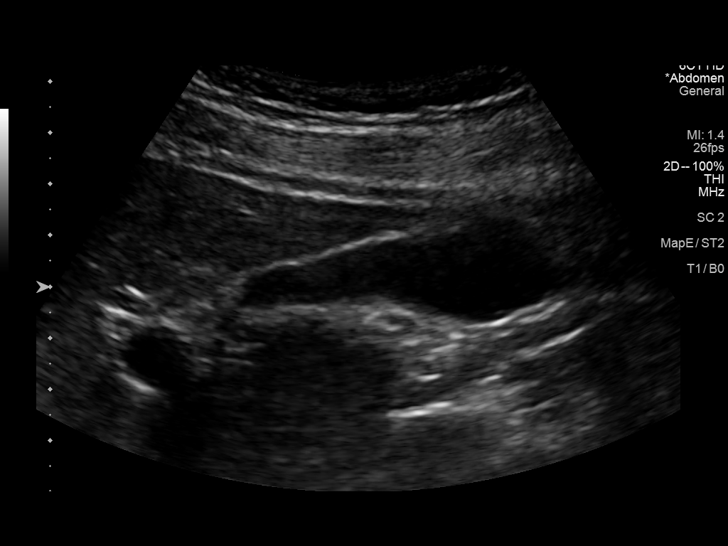
[im 8/44]
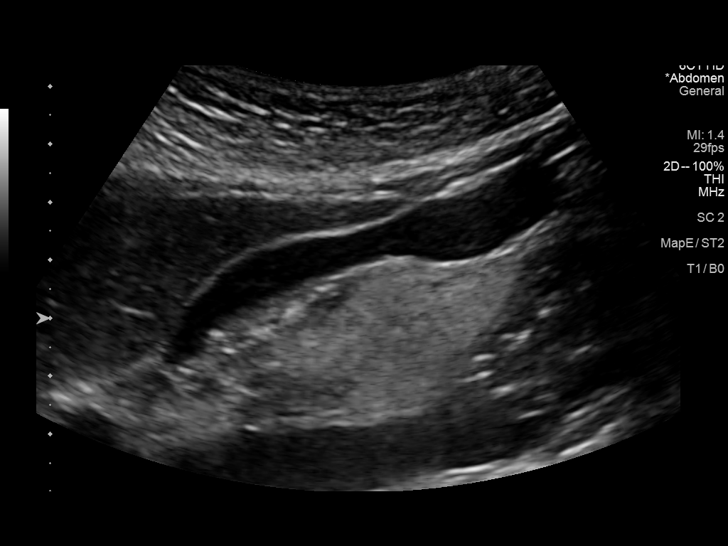
[im 11/44]
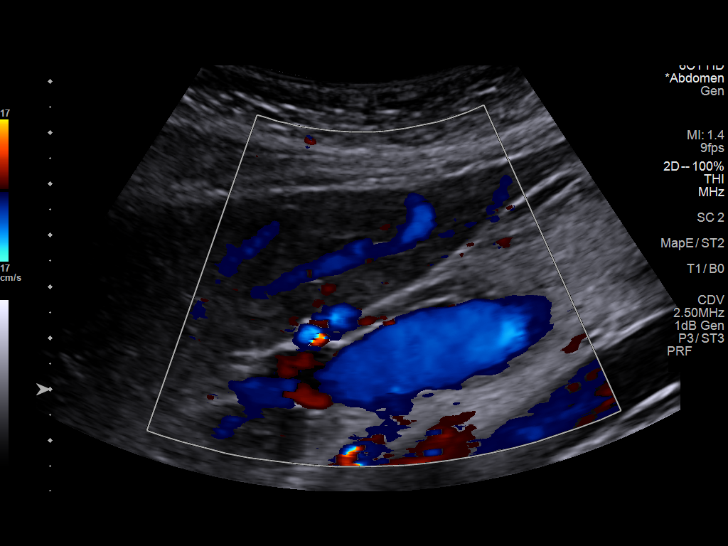
[im 15/44]
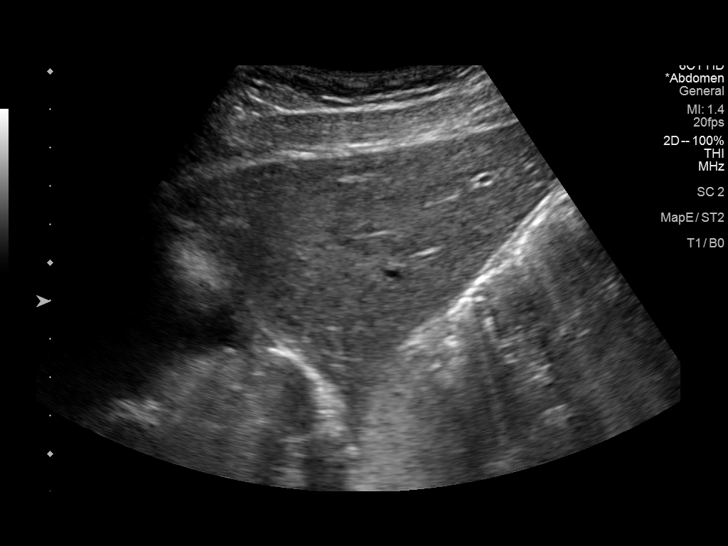
[im 17/44]
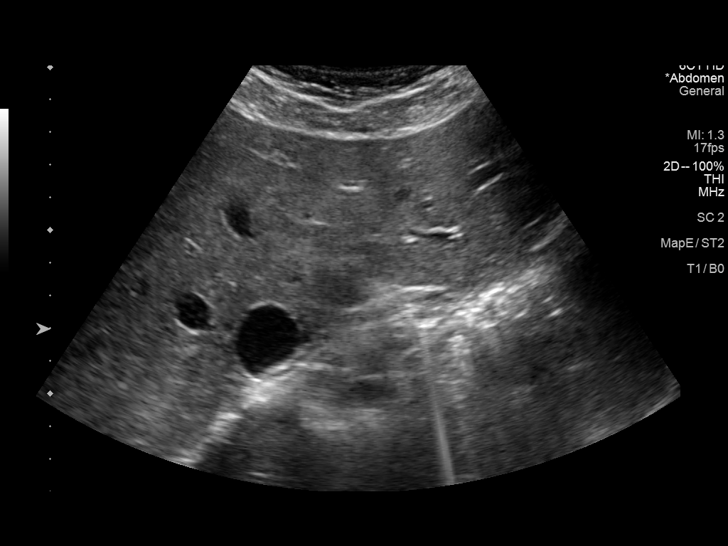
[im 20/44]
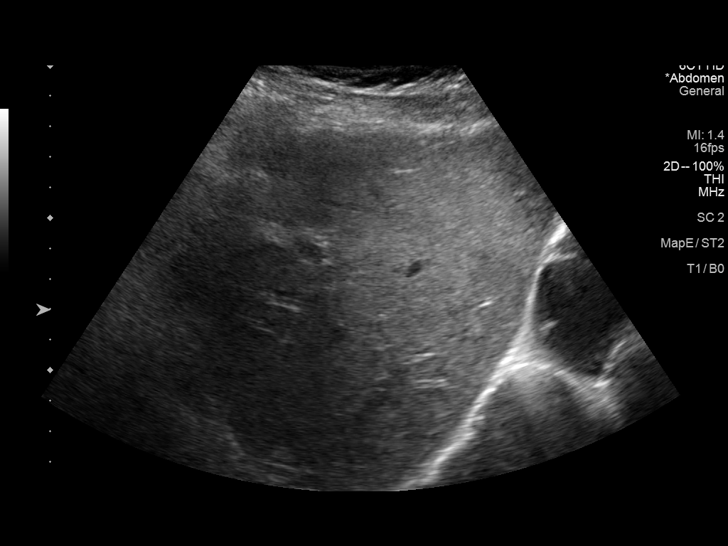
[im 24/44]
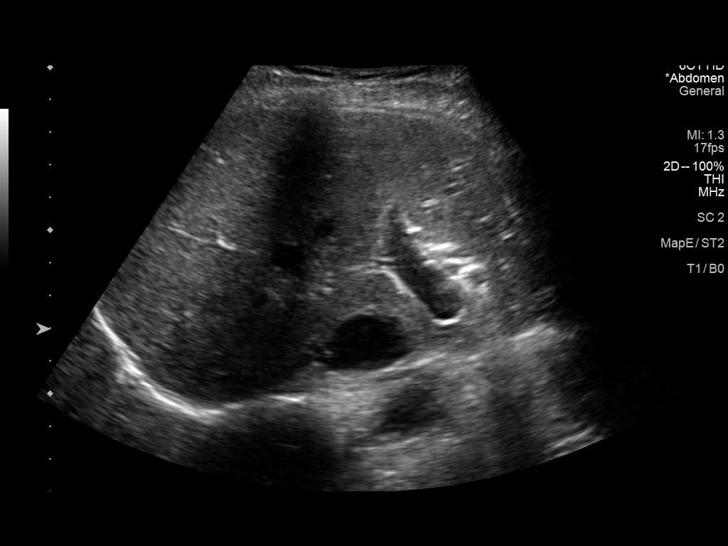
[im 27/44]
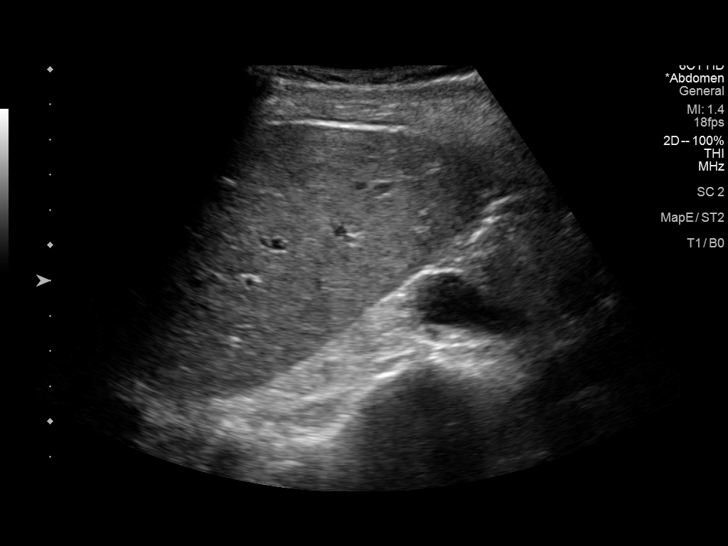
[im 29/44]
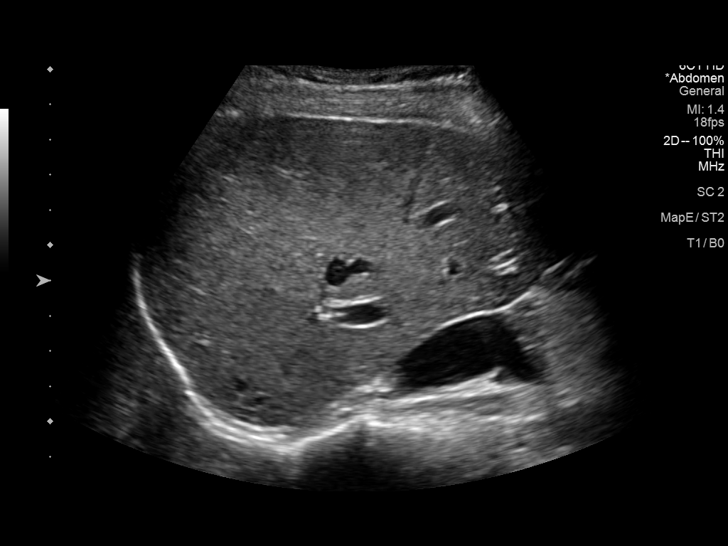
[im 33/44]
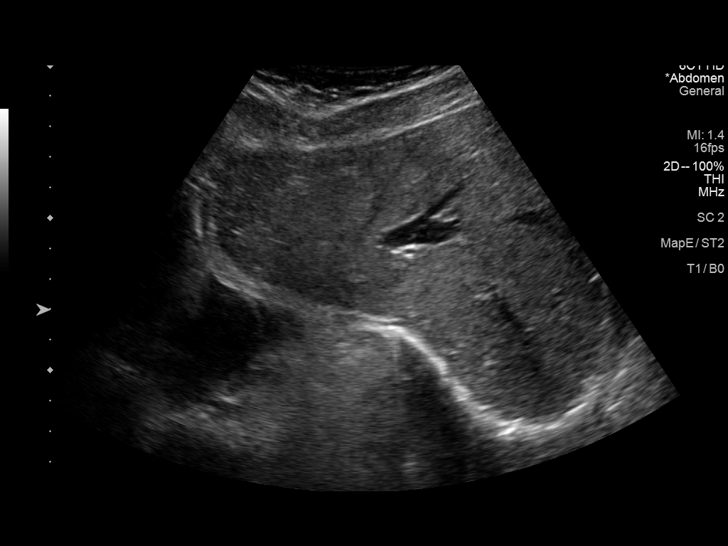
[im 36/44]
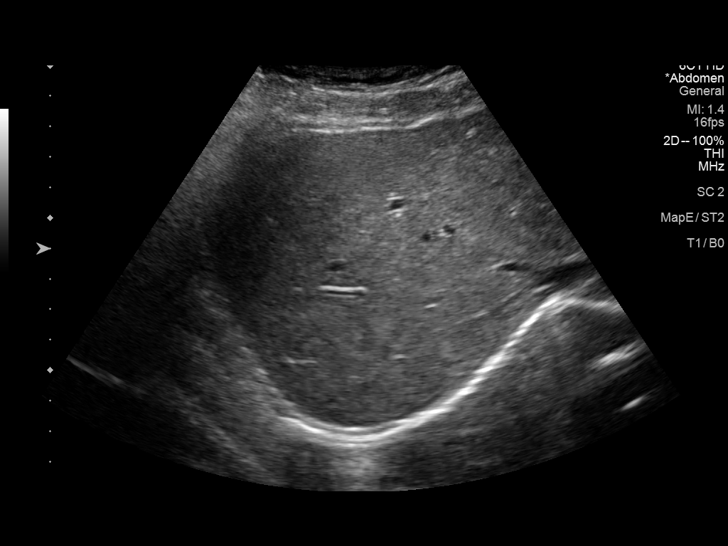
[im 40/44]
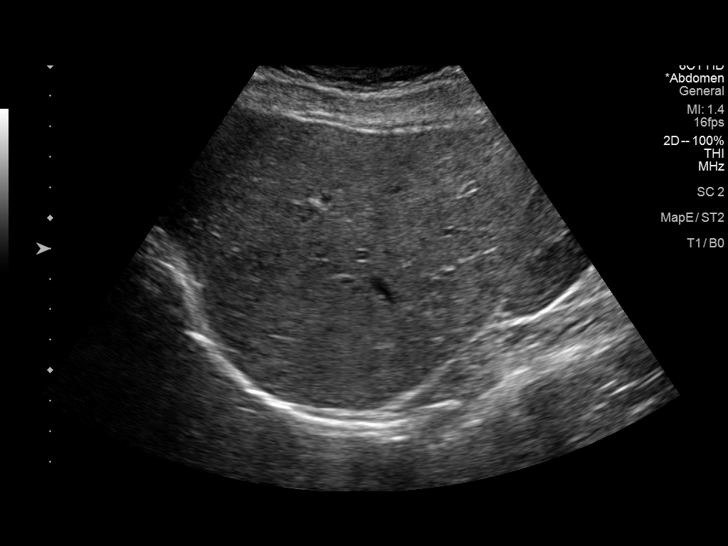
[im 44/44]
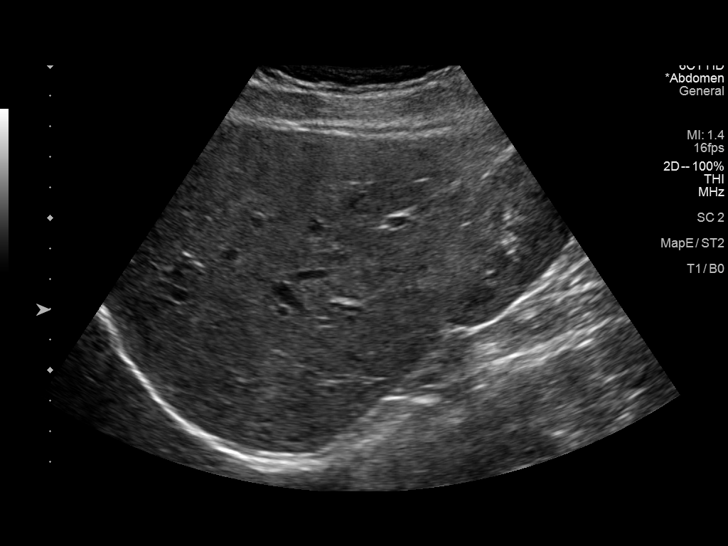

[14 of 25 positions shown; findings below may reference images not displayed]

FINDINGS: Gallbladder:

No gallstones or wall thickening visualized. No sonographic Murphy
sign noted by sonographer.

Common bile duct:

Diameter: 2 mm.

Liver:

No focal lesion identified. Within normal limits in parenchymal
echogenicity. Portal vein is patent on color Doppler imaging with
normal direction of blood flow towards the liver.

Other: None.
IMPRESSION: Unremarkable right upper quadrant ultrasound.

## 2022-04-01 DIAGNOSIS — F331 Major depressive disorder, recurrent, moderate: Secondary | ICD-10-CM | POA: Diagnosis not present

## 2022-04-01 DIAGNOSIS — F411 Generalized anxiety disorder: Secondary | ICD-10-CM | POA: Diagnosis not present

## 2022-04-16 DIAGNOSIS — F331 Major depressive disorder, recurrent, moderate: Secondary | ICD-10-CM | POA: Diagnosis not present

## 2022-04-16 DIAGNOSIS — F411 Generalized anxiety disorder: Secondary | ICD-10-CM | POA: Diagnosis not present

## 2022-04-21 ENCOUNTER — Other Ambulatory Visit (HOSPITAL_COMMUNITY): Payer: Self-pay

## 2022-04-22 DIAGNOSIS — F331 Major depressive disorder, recurrent, moderate: Secondary | ICD-10-CM | POA: Diagnosis not present

## 2022-04-22 DIAGNOSIS — F411 Generalized anxiety disorder: Secondary | ICD-10-CM | POA: Diagnosis not present

## 2022-04-30 ENCOUNTER — Other Ambulatory Visit (HOSPITAL_COMMUNITY): Payer: Self-pay

## 2022-05-06 DIAGNOSIS — F411 Generalized anxiety disorder: Secondary | ICD-10-CM | POA: Diagnosis not present

## 2022-05-06 DIAGNOSIS — F331 Major depressive disorder, recurrent, moderate: Secondary | ICD-10-CM | POA: Diagnosis not present

## 2022-05-27 DIAGNOSIS — F331 Major depressive disorder, recurrent, moderate: Secondary | ICD-10-CM | POA: Diagnosis not present

## 2022-05-27 DIAGNOSIS — F411 Generalized anxiety disorder: Secondary | ICD-10-CM | POA: Diagnosis not present

## 2022-05-29 ENCOUNTER — Other Ambulatory Visit (HOSPITAL_COMMUNITY): Payer: Self-pay

## 2022-06-03 DIAGNOSIS — F411 Generalized anxiety disorder: Secondary | ICD-10-CM | POA: Diagnosis not present

## 2022-06-03 DIAGNOSIS — F331 Major depressive disorder, recurrent, moderate: Secondary | ICD-10-CM | POA: Diagnosis not present

## 2022-06-10 DIAGNOSIS — F411 Generalized anxiety disorder: Secondary | ICD-10-CM | POA: Diagnosis not present

## 2022-06-10 DIAGNOSIS — F331 Major depressive disorder, recurrent, moderate: Secondary | ICD-10-CM | POA: Diagnosis not present

## 2022-07-01 DIAGNOSIS — F411 Generalized anxiety disorder: Secondary | ICD-10-CM | POA: Diagnosis not present

## 2022-07-01 DIAGNOSIS — F331 Major depressive disorder, recurrent, moderate: Secondary | ICD-10-CM | POA: Diagnosis not present

## 2022-07-08 DIAGNOSIS — F331 Major depressive disorder, recurrent, moderate: Secondary | ICD-10-CM | POA: Diagnosis not present

## 2022-07-08 DIAGNOSIS — F411 Generalized anxiety disorder: Secondary | ICD-10-CM | POA: Diagnosis not present

## 2022-07-15 DIAGNOSIS — F411 Generalized anxiety disorder: Secondary | ICD-10-CM | POA: Diagnosis not present

## 2022-07-15 DIAGNOSIS — F331 Major depressive disorder, recurrent, moderate: Secondary | ICD-10-CM | POA: Diagnosis not present

## 2022-07-20 ENCOUNTER — Other Ambulatory Visit (HOSPITAL_COMMUNITY): Payer: Self-pay

## 2022-07-22 ENCOUNTER — Other Ambulatory Visit (HOSPITAL_COMMUNITY): Payer: Self-pay

## 2022-07-23 DIAGNOSIS — F411 Generalized anxiety disorder: Secondary | ICD-10-CM | POA: Diagnosis not present

## 2022-07-23 DIAGNOSIS — F331 Major depressive disorder, recurrent, moderate: Secondary | ICD-10-CM | POA: Diagnosis not present

## 2022-07-24 ENCOUNTER — Other Ambulatory Visit (HOSPITAL_COMMUNITY): Payer: Self-pay

## 2022-07-27 ENCOUNTER — Other Ambulatory Visit (HOSPITAL_COMMUNITY): Payer: Self-pay

## 2022-07-27 DIAGNOSIS — R300 Dysuria: Secondary | ICD-10-CM | POA: Diagnosis not present

## 2022-07-27 DIAGNOSIS — N898 Other specified noninflammatory disorders of vagina: Secondary | ICD-10-CM | POA: Diagnosis not present

## 2022-07-27 DIAGNOSIS — Z124 Encounter for screening for malignant neoplasm of cervix: Secondary | ICD-10-CM | POA: Diagnosis not present

## 2022-07-27 DIAGNOSIS — R87619 Unspecified abnormal cytological findings in specimens from cervix uteri: Secondary | ICD-10-CM | POA: Diagnosis not present

## 2022-07-27 DIAGNOSIS — Z01419 Encounter for gynecological examination (general) (routine) without abnormal findings: Secondary | ICD-10-CM | POA: Diagnosis not present

## 2022-07-27 DIAGNOSIS — N644 Mastodynia: Secondary | ICD-10-CM | POA: Diagnosis not present

## 2022-07-27 DIAGNOSIS — Z3041 Encounter for surveillance of contraceptive pills: Secondary | ICD-10-CM | POA: Diagnosis not present

## 2022-07-27 DIAGNOSIS — Z803 Family history of malignant neoplasm of breast: Secondary | ICD-10-CM | POA: Diagnosis not present

## 2022-07-27 MED ORDER — NORETHIN ACE-ETH ESTRAD-FE 1.5-30 MG-MCG PO TABS
1.0000 | ORAL_TABLET | Freq: Every day | ORAL | 4 refills | Status: DC
  Filled 2022-07-27 – 2022-08-21 (×2): qty 84, 84d supply, fill #0
  Filled 2022-11-12: qty 84, 84d supply, fill #1
  Filled 2023-01-25: qty 84, 84d supply, fill #2
  Filled 2023-04-05: qty 84, 84d supply, fill #3

## 2022-07-28 ENCOUNTER — Other Ambulatory Visit: Payer: Self-pay | Admitting: Obstetrics and Gynecology

## 2022-07-28 DIAGNOSIS — N644 Mastodynia: Secondary | ICD-10-CM

## 2022-07-29 ENCOUNTER — Other Ambulatory Visit (HOSPITAL_COMMUNITY): Payer: Self-pay

## 2022-07-30 ENCOUNTER — Telehealth: Payer: Self-pay | Admitting: Genetic Counselor

## 2022-07-30 NOTE — Telephone Encounter (Signed)
Scheduled appt per 10/5 referral. Pt is aware of appt date and time. Pt is aware to arrive 15 mins prior to appt time and to bring and updated insurance card. Pt is aware of appt location.   

## 2022-08-05 ENCOUNTER — Other Ambulatory Visit (HOSPITAL_COMMUNITY): Payer: Self-pay

## 2022-08-05 DIAGNOSIS — H9203 Otalgia, bilateral: Secondary | ICD-10-CM | POA: Insufficient documentation

## 2022-08-05 MED ORDER — FLUCONAZOLE 150 MG PO TABS
150.0000 mg | ORAL_TABLET | Freq: Every day | ORAL | 0 refills | Status: DC
  Filled 2022-08-05: qty 1, 1d supply, fill #0

## 2022-08-05 MED ORDER — CEFDINIR 300 MG PO CAPS
300.0000 mg | ORAL_CAPSULE | Freq: Two times a day (BID) | ORAL | 0 refills | Status: DC
  Filled 2022-08-05: qty 14, 7d supply, fill #0

## 2022-08-13 ENCOUNTER — Other Ambulatory Visit (HOSPITAL_COMMUNITY): Payer: Self-pay

## 2022-08-13 DIAGNOSIS — R300 Dysuria: Secondary | ICD-10-CM | POA: Diagnosis not present

## 2022-08-13 DIAGNOSIS — B3731 Acute candidiasis of vulva and vagina: Secondary | ICD-10-CM | POA: Diagnosis not present

## 2022-08-13 DIAGNOSIS — N760 Acute vaginitis: Secondary | ICD-10-CM | POA: Diagnosis not present

## 2022-08-13 DIAGNOSIS — N9089 Other specified noninflammatory disorders of vulva and perineum: Secondary | ICD-10-CM | POA: Diagnosis not present

## 2022-08-13 MED ORDER — CLOTRIMAZOLE 1 % EX CREA
1.0000 | TOPICAL_CREAM | Freq: Two times a day (BID) | CUTANEOUS | 1 refills | Status: DC
  Filled 2022-08-13: qty 30, 28d supply, fill #0

## 2022-08-14 ENCOUNTER — Other Ambulatory Visit (HOSPITAL_COMMUNITY): Payer: Self-pay

## 2022-08-19 DIAGNOSIS — F331 Major depressive disorder, recurrent, moderate: Secondary | ICD-10-CM | POA: Diagnosis not present

## 2022-08-19 DIAGNOSIS — F411 Generalized anxiety disorder: Secondary | ICD-10-CM | POA: Diagnosis not present

## 2022-08-20 IMAGING — NM NM HEPATO W/GB/PHARM/[PERSON_NAME]
2 series · 12 of 12 positions shown · non-contrast
Comparison: 08/09/2020

CLINICAL DATA: Abdominal pain

EXAM:
NUCLEAR MEDICINE HEPATOBILIARY IMAGING WITH GALLBLADDER EF
TECHNIQUE: Sequential images of the abdomen were obtained [DATE] minutes
following intravenous administration of radiopharmaceutical. After
oral ingestion of Ensure, gallbladder ejection fraction was
determined. At 60 min, normal ejection fraction is greater than 33%.
RADIOPHARMACEUTICALS:  5.2 mCi Qc-QQm  Choletec IV

[he hepatobiliary · 4.52mm/px · 6 of 60 frames shown (1 of 2)]
[frame 6/60]
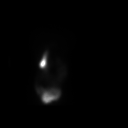
[frame 16/60]
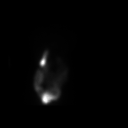
[frame 26/60]
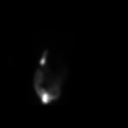
[frame 36/60]
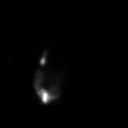
[frame 46/60]
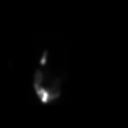
[frame 56/60]
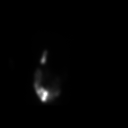

[he hepatobiliary · 4.52mm/px · 6 of 60 frames shown (2 of 2)]
[frame 6/60]
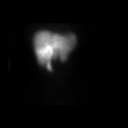
[frame 16/60]
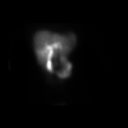
[frame 26/60]
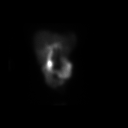
[frame 36/60]
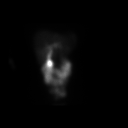
[frame 46/60]
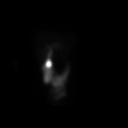
[frame 56/60]
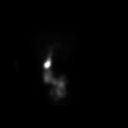

[12 of 12 positions shown; findings below may reference images not displayed]

FINDINGS: Prompt uptake and biliary excretion of activity by the liver is
seen. Gallbladder activity is visualized, consistent with patency of
cystic duct. Biliary activity passes into small bowel, consistent
with patent common bile duct.

Calculated gallbladder ejection fraction is 63%. (Normal gallbladder
ejection fraction with Ensure is greater than 33%.)
IMPRESSION: Normal exam.

## 2022-08-21 ENCOUNTER — Other Ambulatory Visit (HOSPITAL_COMMUNITY): Payer: Self-pay

## 2022-08-26 DIAGNOSIS — F411 Generalized anxiety disorder: Secondary | ICD-10-CM | POA: Diagnosis not present

## 2022-08-26 DIAGNOSIS — F331 Major depressive disorder, recurrent, moderate: Secondary | ICD-10-CM | POA: Diagnosis not present

## 2022-08-28 ENCOUNTER — Ambulatory Visit: Payer: 59

## 2022-08-28 ENCOUNTER — Other Ambulatory Visit: Payer: Self-pay | Admitting: Obstetrics and Gynecology

## 2022-08-28 ENCOUNTER — Ambulatory Visit
Admission: RE | Admit: 2022-08-28 | Discharge: 2022-08-28 | Disposition: A | Discharge status: Home / Self Care | Payer: 59 | Source: Ambulatory Visit | Attending: Obstetrics and Gynecology | Admitting: Obstetrics and Gynecology

## 2022-08-28 DIAGNOSIS — N644 Mastodynia: Secondary | ICD-10-CM | POA: Diagnosis not present

## 2022-08-28 DIAGNOSIS — R92323 Mammographic fibroglandular density, bilateral breasts: Secondary | ICD-10-CM | POA: Diagnosis not present

## 2022-08-28 DIAGNOSIS — N6489 Other specified disorders of breast: Secondary | ICD-10-CM

## 2022-09-02 DIAGNOSIS — F331 Major depressive disorder, recurrent, moderate: Secondary | ICD-10-CM | POA: Diagnosis not present

## 2022-09-02 DIAGNOSIS — F411 Generalized anxiety disorder: Secondary | ICD-10-CM | POA: Diagnosis not present

## 2022-09-07 ENCOUNTER — Other Ambulatory Visit (HOSPITAL_COMMUNITY): Payer: Self-pay

## 2022-09-07 DIAGNOSIS — B3731 Acute candidiasis of vulva and vagina: Secondary | ICD-10-CM | POA: Diagnosis not present

## 2022-09-07 DIAGNOSIS — N871 Moderate cervical dysplasia: Secondary | ICD-10-CM | POA: Diagnosis not present

## 2022-09-07 DIAGNOSIS — B977 Papillomavirus as the cause of diseases classified elsewhere: Secondary | ICD-10-CM | POA: Diagnosis not present

## 2022-09-07 DIAGNOSIS — N879 Dysplasia of cervix uteri, unspecified: Secondary | ICD-10-CM | POA: Diagnosis not present

## 2022-09-07 DIAGNOSIS — Z3202 Encounter for pregnancy test, result negative: Secondary | ICD-10-CM | POA: Diagnosis not present

## 2022-09-07 MED ORDER — FLUCONAZOLE 150 MG PO TABS
ORAL_TABLET | ORAL | 0 refills | Status: DC
  Filled 2022-09-07: qty 2, 3d supply, fill #0

## 2022-09-29 ENCOUNTER — Other Ambulatory Visit (HOSPITAL_COMMUNITY): Payer: Self-pay

## 2022-09-30 DIAGNOSIS — F331 Major depressive disorder, recurrent, moderate: Secondary | ICD-10-CM | POA: Diagnosis not present

## 2022-09-30 DIAGNOSIS — F411 Generalized anxiety disorder: Secondary | ICD-10-CM | POA: Diagnosis not present

## 2022-10-05 DIAGNOSIS — N871 Moderate cervical dysplasia: Secondary | ICD-10-CM | POA: Diagnosis not present

## 2022-10-13 DIAGNOSIS — F331 Major depressive disorder, recurrent, moderate: Secondary | ICD-10-CM | POA: Diagnosis not present

## 2022-10-13 DIAGNOSIS — F411 Generalized anxiety disorder: Secondary | ICD-10-CM | POA: Diagnosis not present

## 2022-10-22 ENCOUNTER — Other Ambulatory Visit (HOSPITAL_COMMUNITY): Payer: Self-pay

## 2022-10-22 DIAGNOSIS — D2372 Other benign neoplasm of skin of left lower limb, including hip: Secondary | ICD-10-CM | POA: Diagnosis not present

## 2022-10-22 DIAGNOSIS — D2261 Melanocytic nevi of right upper limb, including shoulder: Secondary | ICD-10-CM | POA: Diagnosis not present

## 2022-10-22 DIAGNOSIS — D2272 Melanocytic nevi of left lower limb, including hip: Secondary | ICD-10-CM | POA: Diagnosis not present

## 2022-10-22 DIAGNOSIS — L819 Disorder of pigmentation, unspecified: Secondary | ICD-10-CM | POA: Diagnosis not present

## 2022-10-22 DIAGNOSIS — D225 Melanocytic nevi of trunk: Secondary | ICD-10-CM | POA: Diagnosis not present

## 2022-10-22 DIAGNOSIS — D2262 Melanocytic nevi of left upper limb, including shoulder: Secondary | ICD-10-CM | POA: Diagnosis not present

## 2022-10-22 DIAGNOSIS — D2271 Melanocytic nevi of right lower limb, including hip: Secondary | ICD-10-CM | POA: Diagnosis not present

## 2022-10-22 DIAGNOSIS — L814 Other melanin hyperpigmentation: Secondary | ICD-10-CM | POA: Diagnosis not present

## 2022-10-22 DIAGNOSIS — D1801 Hemangioma of skin and subcutaneous tissue: Secondary | ICD-10-CM | POA: Diagnosis not present

## 2022-10-22 MED ORDER — SULFACETAMIDE SODIUM-SULFUR 10-5 % EX LIQD
CUTANEOUS | 3 refills | Status: DC
  Filled 2022-10-22: qty 340, 60d supply, fill #0
  Filled 2022-12-22: qty 340, 60d supply, fill #1
  Filled 2023-02-22: qty 340, 60d supply, fill #2
  Filled 2023-04-21: qty 340, 60d supply, fill #3

## 2022-10-22 MED ORDER — SPIRONOLACTONE 100 MG PO TABS
100.0000 mg | ORAL_TABLET | Freq: Every day | ORAL | 3 refills | Status: DC
  Filled 2022-10-22: qty 90, 90d supply, fill #0
  Filled 2023-01-25: qty 90, 90d supply, fill #1
  Filled 2023-04-21: qty 90, 90d supply, fill #2
  Filled 2023-07-19: qty 90, 90d supply, fill #3

## 2022-10-27 ENCOUNTER — Other Ambulatory Visit (HOSPITAL_COMMUNITY): Payer: Self-pay

## 2022-10-28 DIAGNOSIS — F411 Generalized anxiety disorder: Secondary | ICD-10-CM | POA: Diagnosis not present

## 2022-10-28 DIAGNOSIS — F331 Major depressive disorder, recurrent, moderate: Secondary | ICD-10-CM | POA: Diagnosis not present

## 2022-10-29 ENCOUNTER — Other Ambulatory Visit: Payer: Self-pay

## 2022-10-29 ENCOUNTER — Other Ambulatory Visit: Payer: Self-pay | Admitting: Genetic Counselor

## 2022-10-29 ENCOUNTER — Inpatient Hospital Stay: Payer: Commercial Managed Care - PPO | Admitting: Genetic Counselor

## 2022-10-29 ENCOUNTER — Inpatient Hospital Stay: Payer: Commercial Managed Care - PPO

## 2022-10-29 DIAGNOSIS — Z1379 Encounter for other screening for genetic and chromosomal anomalies: Secondary | ICD-10-CM

## 2022-10-29 DIAGNOSIS — Z803 Family history of malignant neoplasm of breast: Secondary | ICD-10-CM | POA: Diagnosis not present

## 2022-10-29 LAB — GENETIC SCREENING ORDER

## 2022-10-29 NOTE — H&P (Signed)
Megan Sanchez is an 37 y.o. 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  Pap smear/Colposcopy History: 09/07/2022 Colposcopy        Cervical Biopsy 12:00        - Koilocytosis suggestive of HPV effect        Cervical Biopsy, 6:00        - High grade squamous intraepithelial lesion (CIN 2)        Endocervical Curettings        - Minute fragments of benign endocervical glands, mucus, and blood 07/27/2022 NILM/HRHPV + (Negative 16/18/45) 07/26/2021 NILM/HRHPV + (Negative 16/18/45) 06/21/2020 ASCUS/HRHPV neg 06/28/2019 Colposcopy:         Cervical Biopsy, 7:00:         - Inflamed cervical transformation zone mucosa with CIN I         - No invasice neosplasm identified         Cervical Biopsy, 12:00         - Inflamed cervical transformation zone mucosa with CIN I         - No invasice neosplasm identified         Cervical Biopsy, 3:00         - Inflamed cervical transformation zone mucosa with CIN I         - No invasive neosplasm identified         ECC         - Benign endocervical mucosa         - No atypia, dysplasia, or malignancy identified 06/20/2019 LSIL/HRHPV +.  Patient Active Problem List   Diagnosis Date Noted   CIN II (cervical intraepithelial neoplasia II) 11/01/2022   Otalgia, bilateral 08/05/2022   Anxiety disorder, unspecified 04/23/2021   Headache 04/23/2021   Migraine, unspecified, not intractable, without status migrainosus 04/23/2021   ETD (Eustachian tube dysfunction), bilateral 10/08/2020   Bilateral acute serous otitis media 10/08/2020   Rhinitis, chronic 10/08/2020   Irritable bowel syndrome without diarrhea 02/03/2018    MEDICAL/FAMILY/SOCIAL HX: No LMP recorded.    No past medical history on file.  No past surgical history on file.  No family history on file.  Social History:  reports that she has never smoked. She has never used smokeless tobacco. She reports that she does not currently use alcohol. She  reports that she does not use drugs.  ALLERGIES/MEDS:  Allergies:  Allergies  Allergen Reactions   Sertraline Hcl Other (See Comments)    dizzy   Amoxicillin Rash   Penicillin G Sodium Rash    No medications prior to admission.     Review of Systems  Constitutional: Negative.   HENT: Negative.    Eyes: Negative.   Respiratory: Negative.    Cardiovascular: Negative.   Gastrointestinal: Negative.   Genitourinary: Negative.   Musculoskeletal: Negative.   Skin: Negative.     There were no vitals taken for this visit. Gen:  NAD, pleasant and cooperative Cardio:  RRR Pulm:  CTAB, no wheezes/rales/rhonchi Abd:  Soft, non-distended, non-tender throughout, no rebound/guarding Ext:  No bilateral LE edema, no bilateral calf tenderness Pelvic: Labia - unremarkable, vagina - pink moist mucosa, no lesions or abnormal discharge, cervix - no discharge or lesions or CMT, adnexa - no masses or tenderness - uterus non-tender and normal size on palpation   No results found for this or any previous visit (from the past 24 hour(s)).  No results found.  ASSESSMENT/PLAN: Megan Sanchez is a 37 y.o. 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  - Admit to [place] labs (CBC, T&S, COVID screen per protocol) - Diet:  Per anesthesia/ERAS pathway - IVF:  Per anesthesia - VTE Prophylaxis:  SCDs - Antibiotics: None - D/C home same-day  Consents:  The nature of the CKC is to surgically destroy or remove the abnormal areas, and to send a piece of the tissue to the lab for analysis.  The procedure is diagnostic, and usually therapeutic.  This procedure holds risks of infection, bleeding requiring a blood transfusion, incomplete resolution of the abnormality requiring a future procedure or additional follow-up testing, inability to carry a pregnancy (cervical insufficiency), cervical stenosis (narrowing), blood clot (DVT or PE), and  ultimately a risk of death.  Patient understands these risks and agrees to proceed with the above named procedure. Patient was consented for blood products.  The patient is aware that bleeding may result in the need for a blood transfusion which includes risk of transmission of HIV (1:2 million), Hepatitis C (1:2 million), and Hepatitis B (1:200 thousand) and transfusion reaction.  Patient voiced understanding of the above risks as well as understanding of indications for blood transfusion.  Drema Dallas, DO

## 2022-10-30 ENCOUNTER — Encounter: Payer: Self-pay | Admitting: Genetic Counselor

## 2022-10-30 NOTE — Progress Notes (Addendum)
REFERRING PROVIDER: Drema Dallas, DO [address],  [place] [address]  PRIMARY PROVIDER:  Patient, No Pcp Per  PRIMARY REASON FOR VISIT:  1. Family history of breast cancer    HISTORY OF PRESENT ILLNESS:   Megan Sanchez, a 37 y.o. female, was seen for a [facility] cancer genetics consultation at the request of Dr. Delora Fuel due to a family history of cancer.  Megan Sanchez presents to clinic today to discuss the possibility of a hereditary predisposition to cancer, to discuss genetic testing, and to further clarify her future cancer risks, as well as potential cancer risks for family members.   Megan Sanchez is a 37 y.o. female with no personal history of cancer.    RISK FACTORS:  Menarche was at age 21.  First live birth at age 31.  OCP use for approximately  20+  years.  Ovaries intact: yes.  Uterus intact: yes.  Menopausal status: premenopausal.  HRT use: 0 years. Colonoscopy: yes;  she reports one pre-cancerous polyp in 2023 . Mammogram within the last year: yes. Number of breast biopsies: 0. Up to date with pelvic exams: yes. Any excessive radiation exposure in the past: no  No past medical history on file.  No past surgical history on file.  Social History   Socioeconomic History   Marital status: Married    Spouse name: Not on file   Number of children: Not on file   Years of education: Not on file   Highest education level: Not on file  Occupational History   Not on file  Tobacco Use   Smoking status: Never   Smokeless tobacco: Never  Substance and Sexual Activity   Alcohol use: Not Currently   Drug use: Never   Sexual activity: Not on file  Other Topics Concern   Not on file  Social History Narrative   Not on file   Social Determinants of Health   Financial Resource Strain: Not on file  Food Insecurity: Not on file  Transportation Needs: Not on file  Physical Activity: Not on file  Stress: Not on file  Social Connections: Not on file      FAMILY HISTORY:  We obtained a detailed, 4-generation family history.  Significant diagnoses are listed below: Family History  Problem Relation Age of Onset   Melanoma Paternal Grandfather    Basal cell carcinoma Paternal Grandfather    Breast cancer Other        grandmother's sister     Megan Sanchez does not have any information about her maternal family medical history.  Megan Sanchez paternal grandfather was diagnosed with melanoma and basal cell carcinoma, he is deceased. Her paternal first cousin once removed (grandfather's sister's daughter) was diagnosed with breast cancer in her early 52s. Megan Sanchez paternal great aunt (grandmother's sister) was diagnosed with breast cancer at an unknown age, she Is deceased. Megan Sanchez is unaware of previous family history of genetic testing for hereditary cancer risks. There is no reported Ashkenazi Jewish ancestry.  GENETIC COUNSELING ASSESSMENT: Megan Sanchez is a 37 y.o. female with a family history of cancer which is somewhat suggestive of a hereditary predisposition to cancer. We, therefore, discussed and recommended the following at today's visit.   DISCUSSION: We discussed that 5 - 10% of cancer is hereditary, with most cases of hereditary breast cancer associated with BRCA1/2. There are other genes that can be associated with hereditary breast cancer syndromes.  We discussed that testing is beneficial for several  reasons, including knowing about other cancer risks, identifying potential screening and risk-reduction options that may be appropriate, and to understanding if other family members could be at risk for cancer and allowing them to undergo genetic testing.  We reviewed the characteristics, features and inheritance patterns of hereditary cancer syndromes. We also discussed genetic testing, including the appropriate family members to test, the process of testing, insurance coverage and turn-around-time for results. We discussed the  implications of a negative, positive, carrier and/or variant of uncertain significant result. We discussed that negative results would be uninformative given that Megan Sanchez does not have a personal history of cancer.   Megan Sanchez was offered a common hereditary cancer panel (47 genes) and an expanded pan-cancer panel (77 genes). Megan Sanchez was informed of the benefits and limitations of each panel, including that expanded pan-cancer panels contain several genes that do not have clear management guidelines at this point in time.  We also discussed that as the number of genes included on a panel increases, the chances of variants of uncertain significance increases.  After considering the benefits and limitations of each gene panel, Megan Sanchez elected to have [facility] CancerNext-Expanded Panel.  The CancerNext-Expanded gene panel offered by [facility] and includes sequencing, rearrangement, and RNA analysis for the following 77 genes: AIP, ALK, APC, ATM, AXIN2, BAP1, BARD1, BLM, BMPR1A, BRCA1, BRCA2, BRIP1, CDC73, CDH1, CDK4, CDKN1B, CDKN2A, CHEK2, CTNNA1, DICER1, FANCC, FH, FLCN, GALNT12, KIF1B, LZTR1, MAX, MEN1, MET, MLH1, MSH2, MSH3, MSH6, MUTYH, NBN, NF1, NF2, NTHL1, PALB2, PHOX2B, PMS2, POT1, PRKAR1A, PTCH1, PTEN, RAD51C, RAD51D, RB1, RECQL, RET, SDHA, SDHAF2, SDHB, SDHC, SDHD, SMAD4, SMARCA4, SMARCB1, SMARCE1, STK11, SUFU, TMEM127, TP53, TSC1, TSC2, VHL and XRCC2 (sequencing and deletion/duplication); EGFR, EGLN1, HOXB13, KIT, MITF, PDGFRA, POLD1, and POLE (sequencing only); EPCAM and GREM1 (deletion/duplication only).    We discussed with Megan Sanchez that the family history does not meet insurance or NCCN criteria for genetic testing and, therefore, is not highly consistent with a familial hereditary cancer syndrome. However, she does not have any information about her maternal family medical history. Therefore, she would like to pursue genetic testing for the benefit of herself and her family  members.  We discussed that some people do not want to undergo genetic testing due to fear of genetic discrimination.  A federal law called the Genetic Information Non-Discrimination Act (GINA) of 2008 helps protect individuals against genetic discrimination based on their genetic test results.  It impacts both health insurance and employment.  With health insurance, it protects against increased premiums, being kicked off insurance or being forced to take a test in order to be insured.  For employment it protects against hiring, firing and promoting decisions based on genetic test results.  GINA does not apply to those in the [employer], those who work for companies with less than 15 employees, and new life insurance or long-term disability insurance policies.  Health status due to a cancer diagnosis is not protected under GINA.  PLAN: After considering the risks, benefits, and limitations, Megan Sanchez provided informed consent to pursue genetic testing and the blood sample was sent to [facility] for analysis of the CancerNext-Expanded Panel. Results should be available within approximately 2-3 weeks' time, at which point they will be disclosed by telephone to Megan Sanchez, as will any additional recommendations warranted by these results. Megan Sanchez will receive a summary of her genetic counseling visit and a copy of her results once available. This information will also be available in Epic.  Lastly, we encouraged Megan Sanchez to remain in contact with cancer genetics annually so that we can continuously update the family history and inform her of any changes in cancer genetics and testing that may be of benefit for this family.   Ms. Whitcomb questions were answered to her satisfaction today. Our contact information was provided should additional questions or concerns arise. Thank you for the referral and allowing [place] to share in the care of your patient.   Lucille Passy, MS, [facility] Genetic  Counselor [email or web address] (P) [phone]  The patient was seen for a total of 40 minutes in face-to-face genetic counseling. The patient was seen alone.  Drs. Lindi Adie and/or Burr Medico were available to discuss this case as needed.  _______________________________________________________________________ For Office Staff:  Number of people involved in session: 1 Was an Intern/ student involved with case: no

## 2022-11-01 DIAGNOSIS — N871 Moderate cervical dysplasia: Secondary | ICD-10-CM

## 2022-11-02 ENCOUNTER — Other Ambulatory Visit (HOSPITAL_COMMUNITY): Payer: Self-pay

## 2022-11-02 DIAGNOSIS — J302 Other seasonal allergic rhinitis: Secondary | ICD-10-CM | POA: Diagnosis not present

## 2022-11-02 DIAGNOSIS — R519 Headache, unspecified: Secondary | ICD-10-CM | POA: Diagnosis not present

## 2022-11-02 DIAGNOSIS — M542 Cervicalgia: Secondary | ICD-10-CM | POA: Diagnosis not present

## 2022-11-02 DIAGNOSIS — Z13220 Encounter for screening for lipoid disorders: Secondary | ICD-10-CM | POA: Diagnosis not present

## 2022-11-02 DIAGNOSIS — B977 Papillomavirus as the cause of diseases classified elsewhere: Secondary | ICD-10-CM | POA: Diagnosis not present

## 2022-11-02 DIAGNOSIS — Z0000 Encounter for general adult medical examination without abnormal findings: Secondary | ICD-10-CM | POA: Diagnosis not present

## 2022-11-02 DIAGNOSIS — N871 Moderate cervical dysplasia: Secondary | ICD-10-CM | POA: Diagnosis not present

## 2022-11-02 DIAGNOSIS — F419 Anxiety disorder, unspecified: Secondary | ICD-10-CM | POA: Diagnosis not present

## 2022-11-02 DIAGNOSIS — K589 Irritable bowel syndrome without diarrhea: Secondary | ICD-10-CM | POA: Diagnosis not present

## 2022-11-02 DIAGNOSIS — L709 Acne, unspecified: Secondary | ICD-10-CM | POA: Diagnosis not present

## 2022-11-02 MED ORDER — CYCLOBENZAPRINE HCL 10 MG PO TABS
10.0000 mg | ORAL_TABLET | Freq: Every evening | ORAL | 1 refills | Status: DC | PRN
  Filled 2022-11-02: qty 30, 30d supply, fill #0
  Filled 2022-12-22: qty 30, 30d supply, fill #1

## 2022-11-02 MED ORDER — HYDROXYZINE HCL 25 MG PO TABS
25.0000 mg | ORAL_TABLET | Freq: Every day | ORAL | 0 refills | Status: DC | PRN
  Filled 2022-11-02: qty 30, 30d supply, fill #0

## 2022-11-05 DIAGNOSIS — F411 Generalized anxiety disorder: Secondary | ICD-10-CM | POA: Diagnosis not present

## 2022-11-05 DIAGNOSIS — F331 Major depressive disorder, recurrent, moderate: Secondary | ICD-10-CM | POA: Diagnosis not present

## 2022-11-10 ENCOUNTER — Other Ambulatory Visit: Payer: Self-pay

## 2022-11-10 ENCOUNTER — Encounter (HOSPITAL_COMMUNITY): Payer: Self-pay | Admitting: Obstetrics and Gynecology

## 2022-11-10 NOTE — Progress Notes (Signed)
Spoke with pt for pre-op call. Pt denies cardiac history, HTN or Diabetes.   Shower instructions given to pt and she voiced understanding.  

## 2022-11-11 ENCOUNTER — Other Ambulatory Visit: Payer: Self-pay

## 2022-11-11 ENCOUNTER — Ambulatory Visit (HOSPITAL_COMMUNITY)
Admission: RE | Admit: 2022-11-11 | Discharge: 2022-11-11 | Disposition: A | Discharge status: Home / Self Care | Payer: Commercial Managed Care - PPO | Source: Ambulatory Visit | Attending: Obstetrics and Gynecology | Admitting: Obstetrics and Gynecology

## 2022-11-11 ENCOUNTER — Encounter (HOSPITAL_COMMUNITY): Payer: Self-pay | Admitting: Obstetrics and Gynecology

## 2022-11-11 ENCOUNTER — Encounter (HOSPITAL_COMMUNITY)
Admission: RE | Disposition: A | Discharge status: Home / Self Care | Payer: Self-pay | Source: Ambulatory Visit | Attending: Obstetrics and Gynecology

## 2022-11-11 ENCOUNTER — Other Ambulatory Visit (HOSPITAL_COMMUNITY): Payer: Self-pay

## 2022-11-11 ENCOUNTER — Other Ambulatory Visit: Payer: Self-pay | Admitting: Obstetrics and Gynecology

## 2022-11-11 ENCOUNTER — Ambulatory Visit (HOSPITAL_COMMUNITY): Payer: Commercial Managed Care - PPO | Admitting: Certified Registered"

## 2022-11-11 ENCOUNTER — Ambulatory Visit (HOSPITAL_BASED_OUTPATIENT_CLINIC_OR_DEPARTMENT_OTHER): Payer: Commercial Managed Care - PPO | Admitting: Certified Registered"

## 2022-11-11 DIAGNOSIS — N871 Moderate cervical dysplasia: Secondary | ICD-10-CM | POA: Diagnosis not present

## 2022-11-11 HISTORY — DX: COVID-19: U07.1

## 2022-11-11 HISTORY — PX: CERVICAL CONIZATION W/BX: SHX1330

## 2022-11-11 HISTORY — DX: Anxiety disorder, unspecified: F41.9

## 2022-11-11 HISTORY — DX: Headache, unspecified: R51.9

## 2022-11-11 HISTORY — DX: Gastro-esophageal reflux disease without esophagitis: K21.9

## 2022-11-11 HISTORY — DX: Irritable bowel syndrome, unspecified: K58.9

## 2022-11-11 LAB — CBC
HCT: 42.2 % (ref 36.0–46.0)
Hemoglobin: 14.1 g/dL (ref 12.0–15.0)
MCH: 29.7 pg (ref 26.0–34.0)
MCHC: 33.4 g/dL (ref 30.0–36.0)
MCV: 88.8 fL (ref 80.0–100.0)
Platelets: 338 10*3/uL (ref 150–400)
RBC: 4.75 MIL/uL (ref 3.87–5.11)
RDW: 12.2 % (ref 11.5–15.5)
WBC: 6.2 10*3/uL (ref 4.0–10.5)
nRBC: 0 % (ref 0.0–0.2)

## 2022-11-11 LAB — CYTOLOGY - PAP
Diagnosis: NEGATIVE
Diagnosis: NEGATIVE

## 2022-11-11 LAB — POCT PREGNANCY, URINE: Preg Test, Ur: NEGATIVE

## 2022-11-11 SURGERY — CONE BIOPSY, CERVIX
Anesthesia: General | Site: Pelvis

## 2022-11-11 MED ORDER — PROPOFOL 10 MG/ML IV BOLUS
INTRAVENOUS | Status: AC
  Filled 2022-11-11: qty 20

## 2022-11-11 MED ORDER — MIDAZOLAM HCL 2 MG/2ML IJ SOLN
INTRAMUSCULAR | Status: DC | PRN
  Administered 2022-11-11: 2 mg via INTRAVENOUS

## 2022-11-11 MED ORDER — PROMETHAZINE HCL 25 MG/ML IJ SOLN: 6.2500 mg | INTRAMUSCULAR | Status: DC | PRN

## 2022-11-11 MED ORDER — ACETAMINOPHEN 500 MG PO TABS
1000.0000 mg | ORAL_TABLET | Freq: Once | ORAL | Status: AC
  Administered 2022-11-11: 1000 mg via ORAL
  Filled 2022-11-11: qty 2

## 2022-11-11 MED ORDER — LACTATED RINGERS IV SOLN: INTRAVENOUS | Status: DC

## 2022-11-11 MED ORDER — AMISULPRIDE (ANTIEMETIC) 5 MG/2ML IV SOLN: 10.0000 mg | Freq: Once | INTRAVENOUS | Status: DC | PRN

## 2022-11-11 MED ORDER — KETOROLAC TROMETHAMINE 30 MG/ML IJ SOLN: 30.0000 mg | Freq: Once | INTRAMUSCULAR | Status: DC | PRN

## 2022-11-11 MED ORDER — ORAL CARE MOUTH RINSE: 15.0000 mL | Freq: Once | OROMUCOSAL | Status: AC

## 2022-11-11 MED ORDER — LIDOCAINE 2% (20 MG/ML) 5 ML SYRINGE
INTRAMUSCULAR | Status: AC
  Filled 2022-11-11: qty 5

## 2022-11-11 MED ORDER — MIDAZOLAM HCL 2 MG/2ML IJ SOLN
INTRAMUSCULAR | Status: AC
  Filled 2022-11-11: qty 2

## 2022-11-11 MED ORDER — LIDOCAINE 2% (20 MG/ML) 5 ML SYRINGE
INTRAMUSCULAR | Status: DC | PRN
  Administered 2022-11-11: 60 mg via INTRAVENOUS

## 2022-11-11 MED ORDER — IBUPROFEN 800 MG PO TABS
800.0000 mg | ORAL_TABLET | Freq: Three times a day (TID) | ORAL | 1 refills | Status: DC | PRN
  Filled 2022-11-11: qty 30, 10d supply, fill #0
  Filled 2022-12-22: qty 30, 10d supply, fill #1

## 2022-11-11 MED ORDER — PROPOFOL 10 MG/ML IV BOLUS
INTRAVENOUS | Status: DC | PRN
  Administered 2022-11-11: 200 mg via INTRAVENOUS

## 2022-11-11 MED ORDER — OXYCODONE HCL 5 MG PO TABS: 5.0000 mg | ORAL_TABLET | Freq: Once | ORAL | Status: DC | PRN

## 2022-11-11 MED ORDER — IODINE STRONG (LUGOLS) 5 % PO SOLN
ORAL | Status: AC
  Filled 2022-11-11: qty 1

## 2022-11-11 MED ORDER — FENTANYL CITRATE (PF) 100 MCG/2ML IJ SOLN
INTRAMUSCULAR | Status: AC
  Filled 2022-11-11: qty 2

## 2022-11-11 MED ORDER — FERRIC SUBSULFATE 259 MG/GM EX SOLN
CUTANEOUS | Status: AC
  Filled 2022-11-11: qty 8

## 2022-11-11 MED ORDER — HEMOSTATIC AGENTS (NO CHARGE) OPTIME
TOPICAL | Status: DC | PRN
  Administered 2022-11-11 (×2): 1 via TOPICAL

## 2022-11-11 MED ORDER — CHLORHEXIDINE GLUCONATE 0.12 % MT SOLN: 15.0000 mL | Freq: Once | OROMUCOSAL | Status: AC

## 2022-11-11 MED ORDER — CHLORHEXIDINE GLUCONATE 0.12 % MT SOLN
OROMUCOSAL | Status: AC
  Administered 2022-11-11: 15 mL via OROMUCOSAL
  Filled 2022-11-11: qty 15

## 2022-11-11 MED ORDER — OXYCODONE HCL 5 MG/5ML PO SOLN: 5.0000 mg | Freq: Once | ORAL | Status: DC | PRN

## 2022-11-11 MED ORDER — LACTATED RINGERS IV SOLN: INTRAVENOUS | Status: DC | PRN

## 2022-11-11 MED ORDER — 0.9 % SODIUM CHLORIDE (POUR BTL) OPTIME
TOPICAL | Status: DC | PRN
  Administered 2022-11-11: 1000 mL

## 2022-11-11 MED ORDER — ONDANSETRON HCL 4 MG/2ML IJ SOLN
INTRAMUSCULAR | Status: DC | PRN
  Administered 2022-11-11: 4 mg via INTRAVENOUS

## 2022-11-11 MED ORDER — ACETIC ACID 5 % SOLN
Status: AC
  Filled 2022-11-11: qty 500

## 2022-11-11 MED ORDER — FERRIC SUBSULFATE (BULK) SOLN
Status: DC | PRN
  Administered 2022-11-11: 1

## 2022-11-11 MED ORDER — ACETIC ACID 4% SOLUTION: Status: DC | PRN

## 2022-11-11 MED ORDER — FENTANYL CITRATE (PF) 100 MCG/2ML IJ SOLN
25.0000 ug | INTRAMUSCULAR | Status: DC | PRN
  Administered 2022-11-11: 25 ug via INTRAVENOUS

## 2022-11-11 MED ORDER — FENTANYL CITRATE (PF) 250 MCG/5ML IJ SOLN
INTRAMUSCULAR | Status: DC | PRN
  Administered 2022-11-11: 25 ug via INTRAVENOUS
  Administered 2022-11-11: 50 ug via INTRAVENOUS
  Administered 2022-11-11: 25 ug via INTRAVENOUS

## 2022-11-11 MED ORDER — LIDOCAINE-EPINEPHRINE 1 %-1:100000 IJ SOLN
INTRAMUSCULAR | Status: DC | PRN
  Administered 2022-11-11: 10 mL

## 2022-11-11 MED ORDER — DEXAMETHASONE SODIUM PHOSPHATE 10 MG/ML IJ SOLN
INTRAMUSCULAR | Status: DC | PRN
  Administered 2022-11-11: 5 mg via INTRAVENOUS

## 2022-11-11 MED ORDER — FENTANYL CITRATE (PF) 250 MCG/5ML IJ SOLN
INTRAMUSCULAR | Status: AC
  Filled 2022-11-11: qty 5

## 2022-11-11 MED ORDER — LIDOCAINE-EPINEPHRINE 1 %-1:100000 IJ SOLN
INTRAMUSCULAR | Status: AC
  Filled 2022-11-11: qty 1

## 2022-11-11 SURGICAL SUPPLY — 26 items
BLADE SURG 11 STRL SS (BLADE) ×1 IMPLANT
CATH ROBINSON RED A/P 16FR (CATHETERS) ×1 IMPLANT
ELECT BALL LEEP 5MM RED (ELECTRODE) ×1 IMPLANT
ELECT BLADE 6.5 EXT (BLADE) IMPLANT
ELECT REM PT RETURN 9FT ADLT (ELECTROSURGICAL) ×1
ELECTRODE REM PT RTRN 9FT ADLT (ELECTROSURGICAL) ×1 IMPLANT
GLOVE BIO SURGEON STRL SZ 6.5 (GLOVE) ×1 IMPLANT
GLOVE BIOGEL PI IND STRL 6.5 (GLOVE) ×1 IMPLANT
GLOVE BIOGEL PI IND STRL 7.0 (GLOVE) ×1 IMPLANT
GOWN STRL REUS W/ TWL LRG LVL3 (GOWN DISPOSABLE) ×2 IMPLANT
GOWN STRL REUS W/TWL LRG LVL3 (GOWN DISPOSABLE) ×2
HEMOSTAT SURGICEL 4X8 (HEMOSTASIS) IMPLANT
NS IRRIG 1000ML POUR BTL (IV SOLUTION) ×1 IMPLANT
PACK VAGINAL MINOR WOMEN LF (CUSTOM PROCEDURE TRAY) ×1 IMPLANT
PAD OB MATERNITY 4.3X12.25 (PERSONAL CARE ITEMS) ×1 IMPLANT
PENCIL BUTTON HOLSTER BLD 10FT (ELECTRODE) ×1 IMPLANT
PENCIL SMOKE EVACUATOR (MISCELLANEOUS) IMPLANT
SCOPETTES 8  STERILE (MISCELLANEOUS) ×1
SCOPETTES 8 STERILE (MISCELLANEOUS) ×1 IMPLANT
SPONGE SURGIFOAM ABS GEL 12-7 (HEMOSTASIS) IMPLANT
SUT SILK 2 0 SH (SUTURE) ×1 IMPLANT
SUT VIC AB 0 CT1 27 (SUTURE) ×2
SUT VIC AB 0 CT1 27XBRD ANBCTR (SUTURE) ×2 IMPLANT
TOWEL GREEN STERILE FF (TOWEL DISPOSABLE) ×2 IMPLANT
TUBE CONNECTING 12X1/4 (SUCTIONS) ×1 IMPLANT
YANKAUER SUCT BULB TIP NO VENT (SUCTIONS) ×1 IMPLANT

## 2022-11-11 NOTE — Op Note (Addendum)
Pre Op Dx:   Cervical Intraepithelial Neoplasia II (CIN II)  Post Op Dx:   Same as pre-operative diagnosis  Procedure:   1. Cold knife conization of the cervix 2. Endocervical curettings  Surgeon:  Dr. Wellington Hampshire. Delora Fuel Assistants:  None Anesthesia:  LMA  EBL:  10cc  IVF:  See anesthesia documentation UOP:  200cc via in-and-out catheter  Drains:  None Specimen removed:  Cervical cone biopsy -- sent to pathology  Endocervical curettings -- sent to cytology Device(s) implanted:  None Case Type:  Clean-contaminated Findings: Normal-appearing nulliparous cervix. Moderate uterine descensus. Complications: None Indications:  37 y.o. yo patient with abnormal Pap, cervical biopsy showing CIN II and benign, minute endocervical curettings.  Description of Procedure:  After informed consent was obtained the patient was brought to the operating room.  She was placed in dorsal supine position and anesthesia was administered.  She was placed in dorsal lithotomy position and prepped and draped in the usual sterile fashion.  Her bladder was emptied.  A preoperative timeout was called.  Para cervical block and cervical with 10cc of 1% Lidocaine with epinephrine performed. Two 0-Vicryl sutures were placed at 3 and 9 o'clock on the cervix for traction and hemostasis.  A circumferential cervical incision was initiated and a traction suture was placed in the specimen, tagging the specimen at 12 o'clock.  The cone biopsy was completed sharply with scissors and passed off the field. Endocervical curettings obtained.  The bed of the cone biopsy was made hemostatic with electrosurgery. Monsel's painted over the cervical wound bed.  A piece of Surgicel was then tied in place over the cone bed for additional hemostasis.  She was returned to dorsal supine position, awakened and extubated having appeared to tolerate the procedure well.  All counts were correct.  She was transferred to PACU in good condition.    Disposition:  PACU   Drema Dallas, DO

## 2022-11-11 NOTE — Transfer of Care (Signed)
Immediate Anesthesia Transfer of Care Note  Patient: Megan Sanchez  Procedure(s) Performed: COLD KNIFE CONIZATION (Pelvis)  Patient Location: PACU  Anesthesia Type:General  Level of Consciousness: awake and alert   Airway & Oxygen Therapy: Patient Spontanous Breathing and Patient connected to nasal cannula oxygen  Post-op Assessment: Report given to RN and Post -op Vital signs reviewed and stable  Post vital signs: Reviewed and stable  Last Vitals:  Vitals Value Taken Time  BP 107/48 11/11/22 1546  Temp 36.4 C 11/11/22 1535  Pulse 119 11/11/22 1548  Resp 19 11/11/22 1548  SpO2 100 % 11/11/22 1548  Vitals shown include unvalidated device data.  Last Pain:  Vitals:   11/11/22 1535  TempSrc:   PainSc: 0-No pain         Complications: No notable events documented.

## 2022-11-11 NOTE — Anesthesia Postprocedure Evaluation (Signed)
Anesthesia Post Note  Patient: Megan Sanchez  Procedure(s) Performed: COLD KNIFE CONIZATION (Pelvis)     Patient location during evaluation: PACU Anesthesia Type: General Level of consciousness: awake Pain management: pain level controlled Vital Signs Assessment: post-procedure vital signs reviewed and stable Respiratory status: spontaneous breathing, nonlabored ventilation and respiratory function stable Cardiovascular status: blood pressure returned to baseline and stable Postop Assessment: no apparent nausea or vomiting Anesthetic complications: no   No notable events documented.  Last Vitals:  Vitals:   11/11/22 1600 11/11/22 1615  BP: 118/78 119/77  Pulse: (!) 102 97  Resp: 14 13  Temp:  (!) 36.2 C  SpO2: 100% 100%    Last Pain:  Vitals:   11/11/22 1600  TempSrc:   PainSc: 5                  Lelend Heinecke P Vangie Henthorn

## 2022-11-11 NOTE — Anesthesia Preprocedure Evaluation (Addendum)
Anesthesia Evaluation  Patient identified by MRN, date of birth, ID band Patient awake    Reviewed: Allergy & Precautions, NPO status , Patient's Chart, lab work & pertinent test results  Airway Mallampati: II  TM Distance: >3 FB Neck ROM: Full    Dental no notable dental hx.    Pulmonary neg pulmonary ROS   Pulmonary exam normal        Cardiovascular negative cardio ROS Normal cardiovascular exam     Neuro/Psych  Headaches PSYCHIATRIC DISORDERS Anxiety        GI/Hepatic negative GI ROS, Neg liver ROS,,,  Endo/Other  negative endocrine ROS    Renal/GU negative Renal ROS     Musculoskeletal negative musculoskeletal ROS (+)    Abdominal   Peds  Hematology negative hematology ROS (+)   Anesthesia Other Findings Cervical Intraepithelial Neoplasia II  Reproductive/Obstetrics Hcg negative                             Anesthesia Physical Anesthesia Plan  ASA: 1  Anesthesia Plan: General   Post-op Pain Management:    Induction: Intravenous  PONV Risk Score and Plan: 4 or greater and Ondansetron, Dexamethasone, Midazolam and Treatment may vary due to age or medical condition  Airway Management Planned: LMA  Additional Equipment:   Intra-op Plan:   Post-operative Plan: Extubation in OR  Informed Consent: I have reviewed the patients History and Physical, chart, labs and discussed the procedure including the risks, benefits and alternatives for the proposed anesthesia with the patient or authorized representative who has indicated his/her understanding and acceptance.     Dental advisory given  Plan Discussed with: CRNA  Anesthesia Plan Comments: (CMP from 11/02/22 from [facility] portal reviewed with patient)       Anesthesia Quick Evaluation

## 2022-11-11 NOTE — Interval H&P Note (Signed)
History and Physical Interval Note:  11/11/2022 1:47 PM  Megan Sanchez  has presented today for surgery, with the diagnosis of Cervical Intraepithelial Neoplasia II.  The various methods of treatment have been discussed with the patient and family. After consideration of risks, benefits and other options for treatment, the patient has consented to  Procedure(s): CONIZATION CERVIX WITH BIOPSY (N/A) as a surgical intervention.  The patient's history has been reviewed, patient examined, no change in status, stable for surgery.  I have reviewed the patient's chart and labs.  Questions were answered to the patient's satisfaction.     Drema Dallas

## 2022-11-11 NOTE — Anesthesia Procedure Notes (Signed)
Procedure Name: LMA Insertion Date/Time: 11/11/2022 2:54 PM  Performed by: Timoteo Expose, CRNAPre-anesthesia Checklist: Patient identified, Emergency Drugs available, Suction available and Patient being monitored Patient Re-evaluated:Patient Re-evaluated prior to induction Oxygen Delivery Method: Circle system utilized Preoxygenation: Pre-oxygenation with 100% oxygen Induction Type: IV induction Ventilation: Mask ventilation without difficulty LMA: LMA inserted LMA Size: 3.0 Tube type: Oral Number of attempts: 1 Airway Equipment and Method: Stylet and Oral airway Placement Confirmation: ETT inserted through vocal cords under direct vision, positive ETCO2 and breath sounds checked- equal and bilateral Tube secured with: Tape Dental Injury: Teeth and Oropharynx as per pre-operative assessment

## 2022-11-12 ENCOUNTER — Other Ambulatory Visit (HOSPITAL_COMMUNITY): Payer: Self-pay

## 2022-11-12 ENCOUNTER — Encounter (HOSPITAL_COMMUNITY): Payer: Self-pay | Admitting: Obstetrics and Gynecology

## 2022-11-12 LAB — ABO/RH
ABO/RH(D): A NEG
Weak D: POSITIVE

## 2022-11-12 LAB — TYPE AND SCREEN
ABO/RH(D): A NEG
Antibody Screen: NEGATIVE

## 2022-11-13 ENCOUNTER — Telehealth: Payer: Self-pay | Admitting: Genetic Counselor

## 2022-11-13 ENCOUNTER — Encounter: Payer: Self-pay | Admitting: Genetic Counselor

## 2022-11-13 DIAGNOSIS — Z1379 Encounter for other screening for genetic and chromosomal anomalies: Secondary | ICD-10-CM | POA: Insufficient documentation

## 2022-11-13 LAB — SURGICAL PATHOLOGY

## 2022-11-13 LAB — CYTOLOGY - NON PAP

## 2022-11-13 NOTE — Telephone Encounter (Signed)
I contacted Ms. Minckler to discuss her genetic testing results. No pathogenic variants were identified in the 77 genes analyzed. Detailed clinic note to follow.  The test report has been scanned into EPIC and is located under the Molecular Pathology section of the Results Review tab.  A portion of the result report is included below for reference.   Lucille Passy, MS, [facility] Genetic Counselor [email or web address] (P) [phone]

## 2022-11-17 ENCOUNTER — Encounter: Payer: Self-pay | Admitting: Genetic Counselor

## 2022-11-18 ENCOUNTER — Other Ambulatory Visit (HOSPITAL_COMMUNITY): Payer: Self-pay

## 2022-11-18 DIAGNOSIS — N762 Acute vulvitis: Secondary | ICD-10-CM | POA: Diagnosis not present

## 2022-11-18 DIAGNOSIS — N898 Other specified noninflammatory disorders of vagina: Secondary | ICD-10-CM | POA: Diagnosis not present

## 2022-11-18 MED ORDER — TRIAMCINOLONE ACETONIDE 0.5 % EX OINT
1.0000 | TOPICAL_OINTMENT | Freq: Two times a day (BID) | CUTANEOUS | 0 refills | Status: AC | PRN
  Filled 2022-11-18: qty 30, 14d supply, fill #0

## 2022-11-25 DIAGNOSIS — F331 Major depressive disorder, recurrent, moderate: Secondary | ICD-10-CM | POA: Diagnosis not present

## 2022-11-25 DIAGNOSIS — F411 Generalized anxiety disorder: Secondary | ICD-10-CM | POA: Diagnosis not present

## 2022-11-27 ENCOUNTER — Ambulatory Visit: Payer: Self-pay | Admitting: Genetic Counselor

## 2022-11-27 DIAGNOSIS — Z1379 Encounter for other screening for genetic and chromosomal anomalies: Secondary | ICD-10-CM

## 2022-11-27 NOTE — Progress Notes (Signed)
HPI:   [name] was previously seen in the [facility] due to a family history of cancer and concerns regarding a hereditary predisposition to cancer. Please refer to our prior cancer genetics clinic note for more information regarding our discussion, assessment and recommendations, at the time. Megan Sanchez recent genetic test results were disclosed to her, as were recommendations warranted by these results. These results and recommendations are discussed in more detail below.  CANCER HISTORY:  Oncology History   No history exists.    FAMILY HISTORY:  We obtained a detailed, 4-generation family history.  Significant diagnoses are listed below:      Family History  Problem Relation Age of Onset   Melanoma Paternal Grandfather     Basal cell carcinoma Paternal Grandfather     Breast cancer Other          grandmother's sister       Ms. Port does not have any information about her maternal family medical history.   Megan Sanchez paternal grandfather was diagnosed with melanoma and basal cell carcinoma, he is deceased. Her paternal first cousin once removed (grandfather's sister's daughter) was diagnosed with breast cancer in her early 59s. Megan Sanchez paternal great aunt (grandmother's sister) was diagnosed with breast cancer at an unknown age, she Is deceased. Megan Sanchez is unaware of previous family history of genetic testing for hereditary cancer risks. There is no reported Ashkenazi Jewish ancestry.   GENETIC TEST RESULTS:  The [facility] CancerNext-Expanded Panel found no pathogenic mutations.  The CancerNext-Expanded gene panel offered by [facility] and includes sequencing, rearrangement, and RNA analysis for the following 77 genes: AIP, ALK, APC, ATM, AXIN2, BAP1, BARD1, BLM, BMPR1A, BRCA1, BRCA2, BRIP1, CDC73, CDH1, CDK4, CDKN1B, CDKN2A, CHEK2, CTNNA1, DICER1, FANCC, FH, FLCN, GALNT12, KIF1B, LZTR1, MAX, MEN1, MET, MLH1, MSH2, MSH3, MSH6, MUTYH, NBN, NF1,  NF2, NTHL1, PALB2, PHOX2B, PMS2, POT1, PRKAR1A, PTCH1, PTEN, RAD51C, RAD51D, RB1, RECQL, RET, SDHA, SDHAF2, SDHB, SDHC, SDHD, SMAD4, SMARCA4, SMARCB1, SMARCE1, STK11, SUFU, TMEM127, TP53, TSC1, TSC2, VHL and XRCC2 (sequencing and deletion/duplication); EGFR, EGLN1, HOXB13, KIT, MITF, PDGFRA, POLD1, and POLE (sequencing only); EPCAM and GREM1 (deletion/duplication only).   The test report has been scanned into EPIC and is located under the Molecular Pathology section of the Results Review tab.  A portion of the result report is included below for reference. Genetic testing reported out on 11/08/2022.       Even though a pathogenic variant was not identified, possible explanations for the cancer in the family may include: There may be no hereditary risk for cancer in the family. The cancers in her family may be due to other genetic or environmental factors. There may be a gene mutation in one of these genes that current testing methods cannot detect, but that chance is small. There could be another gene that has not yet been discovered, or that we have not yet tested, that is responsible for the cancer diagnoses in the family.  It is also possible there is a hereditary cause for the cancer in the family that [name] did not inherit.  Therefore, it is important to remain in touch with cancer genetics in the future so that we can continue to offer [name] the most up to date genetic testing.   ADDITIONAL GENETIC TESTING:  We discussed with Megan Sanchez that her genetic testing was fairly extensive.  If there are genes identified to increase cancer risk that can be analyzed in the future, we  would be happy to discuss and coordinate this testing at that time.    CANCER SCREENING RECOMMENDATIONS:  Megan Sanchez test result is considered negative (normal).  This means that we have not identified a hereditary cause for her family history of cancer at this time. An individual's cancer risk and medical  management are not determined by genetic test results alone. Overall cancer risk assessment incorporates additional factors, including personal medical history, family history, and any available genetic information that may result in a personalized plan for cancer prevention and surveillance. Therefore, it is recommended she continue to follow the cancer management and screening guidelines provided by her primary healthcare provider.  FOLLOW-UP:  Cancer genetics is a rapidly advancing field and it is possible that new genetic tests will be appropriate for her and/or her family members in the future. We encouraged her to remain in contact with cancer genetics on an annual basis so we can update her personal and family histories and let her know of advances in cancer genetics that may benefit this family.   Our contact number was provided. Megan Sanchez questions were answered to her satisfaction, and she knows she is welcome to call us at anytime with additional questions or concerns.   Lucille Passy, MS, [facility] Genetic Counselor [email or web address] (P) [phone]

## 2022-12-10 DIAGNOSIS — F411 Generalized anxiety disorder: Secondary | ICD-10-CM | POA: Diagnosis not present

## 2022-12-10 DIAGNOSIS — F331 Major depressive disorder, recurrent, moderate: Secondary | ICD-10-CM | POA: Diagnosis not present

## 2022-12-21 DIAGNOSIS — F4323 Adjustment disorder with mixed anxiety and depressed mood: Secondary | ICD-10-CM | POA: Diagnosis not present

## 2022-12-22 ENCOUNTER — Other Ambulatory Visit (HOSPITAL_COMMUNITY): Payer: Self-pay

## 2022-12-23 ENCOUNTER — Other Ambulatory Visit (HOSPITAL_COMMUNITY): Payer: Self-pay

## 2023-01-04 ENCOUNTER — Other Ambulatory Visit (HOSPITAL_COMMUNITY): Payer: Self-pay

## 2023-01-04 DIAGNOSIS — F411 Generalized anxiety disorder: Secondary | ICD-10-CM | POA: Diagnosis not present

## 2023-01-04 DIAGNOSIS — H938X2 Other specified disorders of left ear: Secondary | ICD-10-CM | POA: Diagnosis not present

## 2023-01-04 DIAGNOSIS — H8113 Benign paroxysmal vertigo, bilateral: Secondary | ICD-10-CM | POA: Diagnosis not present

## 2023-01-04 DIAGNOSIS — M542 Cervicalgia: Secondary | ICD-10-CM | POA: Diagnosis not present

## 2023-01-04 DIAGNOSIS — R519 Headache, unspecified: Secondary | ICD-10-CM | POA: Diagnosis not present

## 2023-01-04 MED ORDER — HYDROXYZINE HCL 25 MG PO TABS
25.0000 mg | ORAL_TABLET | Freq: Every day | ORAL | 1 refills | Status: DC | PRN
  Filled 2023-01-04: qty 30, 30d supply, fill #0
  Filled 2023-02-22: qty 30, 30d supply, fill #1

## 2023-01-04 MED ORDER — PREDNISONE 20 MG PO TABS
ORAL_TABLET | ORAL | 0 refills | Status: AC
  Filled 2023-01-04: qty 12, 6d supply, fill #0

## 2023-01-04 MED ORDER — CYCLOBENZAPRINE HCL 10 MG PO TABS
10.0000 mg | ORAL_TABLET | Freq: Every evening | ORAL | 1 refills | Status: DC | PRN
  Filled 2023-01-04 – 2023-01-25 (×2): qty 30, 30d supply, fill #0
  Filled 2023-02-22: qty 30, 30d supply, fill #1

## 2023-01-11 DIAGNOSIS — F4323 Adjustment disorder with mixed anxiety and depressed mood: Secondary | ICD-10-CM | POA: Diagnosis not present

## 2023-01-25 ENCOUNTER — Other Ambulatory Visit: Payer: Self-pay

## 2023-01-25 ENCOUNTER — Other Ambulatory Visit (HOSPITAL_COMMUNITY): Payer: Self-pay

## 2023-01-25 DIAGNOSIS — F4323 Adjustment disorder with mixed anxiety and depressed mood: Secondary | ICD-10-CM | POA: Diagnosis not present

## 2023-02-08 DIAGNOSIS — F4323 Adjustment disorder with mixed anxiety and depressed mood: Secondary | ICD-10-CM | POA: Diagnosis not present

## 2023-02-22 ENCOUNTER — Other Ambulatory Visit (HOSPITAL_COMMUNITY): Payer: Self-pay

## 2023-02-23 ENCOUNTER — Other Ambulatory Visit (HOSPITAL_COMMUNITY): Payer: Self-pay

## 2023-02-23 ENCOUNTER — Other Ambulatory Visit: Payer: Self-pay

## 2023-03-01 ENCOUNTER — Ambulatory Visit
Admission: RE | Admit: 2023-03-01 | Discharge: 2023-03-01 | Disposition: A | Discharge status: Home / Self Care | Payer: Commercial Managed Care - PPO | Source: Ambulatory Visit | Attending: Obstetrics and Gynecology

## 2023-03-01 DIAGNOSIS — N6489 Other specified disorders of breast: Secondary | ICD-10-CM

## 2023-03-01 DIAGNOSIS — R922 Inconclusive mammogram: Secondary | ICD-10-CM | POA: Diagnosis not present

## 2023-03-15 DIAGNOSIS — F4323 Adjustment disorder with mixed anxiety and depressed mood: Secondary | ICD-10-CM | POA: Diagnosis not present

## 2023-03-30 ENCOUNTER — Other Ambulatory Visit (HOSPITAL_COMMUNITY): Payer: Self-pay

## 2023-03-30 DIAGNOSIS — R3989 Other symptoms and signs involving the genitourinary system: Secondary | ICD-10-CM | POA: Diagnosis not present

## 2023-03-30 DIAGNOSIS — N766 Ulceration of vulva: Secondary | ICD-10-CM | POA: Diagnosis not present

## 2023-03-30 DIAGNOSIS — N9089 Other specified noninflammatory disorders of vulva and perineum: Secondary | ICD-10-CM | POA: Diagnosis not present

## 2023-03-30 DIAGNOSIS — R350 Frequency of micturition: Secondary | ICD-10-CM | POA: Diagnosis not present

## 2023-03-30 DIAGNOSIS — R39198 Other difficulties with micturition: Secondary | ICD-10-CM | POA: Diagnosis not present

## 2023-03-30 MED ORDER — FLUCONAZOLE 150 MG PO TABS
150.0000 mg | ORAL_TABLET | ORAL | 0 refills | Status: DC
  Filled 2023-03-30: qty 2, 3d supply, fill #0

## 2023-03-30 MED ORDER — LIDOCAINE 5 % EX OINT
1.0000 | TOPICAL_OINTMENT | Freq: Three times a day (TID) | CUTANEOUS | 0 refills | Status: AC
  Filled 2023-03-30: qty 35.44, 12d supply, fill #0

## 2023-04-05 ENCOUNTER — Other Ambulatory Visit: Payer: Self-pay

## 2023-04-06 ENCOUNTER — Other Ambulatory Visit (HOSPITAL_COMMUNITY): Payer: Self-pay

## 2023-04-06 MED ORDER — NORETHIN ACE-ETH ESTRAD-FE 1.5-30 MG-MCG PO TABS
1.0000 | ORAL_TABLET | Freq: Every day | ORAL | 4 refills | Status: DC
  Filled 2023-04-06 – 2023-04-14 (×2): qty 84, 63d supply, fill #0
  Filled 2023-06-21: qty 84, 63d supply, fill #1

## 2023-04-14 ENCOUNTER — Other Ambulatory Visit (HOSPITAL_COMMUNITY): Payer: Self-pay

## 2023-04-15 ENCOUNTER — Other Ambulatory Visit (HOSPITAL_COMMUNITY): Payer: Self-pay

## 2023-04-21 ENCOUNTER — Other Ambulatory Visit (HOSPITAL_COMMUNITY): Payer: Self-pay

## 2023-04-22 ENCOUNTER — Other Ambulatory Visit: Payer: Self-pay

## 2023-06-08 ENCOUNTER — Other Ambulatory Visit (HOSPITAL_COMMUNITY): Payer: Self-pay

## 2023-06-08 DIAGNOSIS — B3732 Chronic candidiasis of vulva and vagina: Secondary | ICD-10-CM | POA: Diagnosis not present

## 2023-06-08 DIAGNOSIS — R3915 Urgency of urination: Secondary | ICD-10-CM | POA: Diagnosis not present

## 2023-06-08 DIAGNOSIS — R350 Frequency of micturition: Secondary | ICD-10-CM | POA: Diagnosis not present

## 2023-06-08 MED ORDER — GEMTESA 75 MG PO TABS
75.0000 mg | ORAL_TABLET | Freq: Every day | ORAL | 3 refills | Status: DC
  Filled 2023-06-08: qty 90, 90d supply, fill #0

## 2023-06-09 ENCOUNTER — Other Ambulatory Visit (HOSPITAL_COMMUNITY): Payer: Self-pay

## 2023-06-10 ENCOUNTER — Other Ambulatory Visit (HOSPITAL_COMMUNITY): Payer: Self-pay

## 2023-06-15 ENCOUNTER — Other Ambulatory Visit (HOSPITAL_COMMUNITY): Payer: Self-pay

## 2023-06-16 ENCOUNTER — Other Ambulatory Visit (HOSPITAL_COMMUNITY): Payer: Self-pay

## 2023-06-21 ENCOUNTER — Other Ambulatory Visit (HOSPITAL_COMMUNITY): Payer: Self-pay

## 2023-06-22 ENCOUNTER — Other Ambulatory Visit: Payer: Self-pay | Admitting: Obstetrics and Gynecology

## 2023-06-22 ENCOUNTER — Other Ambulatory Visit (HOSPITAL_COMMUNITY): Payer: Self-pay

## 2023-06-22 ENCOUNTER — Other Ambulatory Visit (HOSPITAL_COMMUNITY)
Admission: RE | Admit: 2023-06-22 | Discharge: 2023-06-22 | Disposition: A | Discharge status: Home / Self Care | Payer: Commercial Managed Care - PPO | Source: Ambulatory Visit | Attending: Obstetrics and Gynecology | Admitting: Obstetrics and Gynecology

## 2023-06-22 DIAGNOSIS — Z9889 Other specified postprocedural states: Secondary | ICD-10-CM | POA: Diagnosis not present

## 2023-06-22 DIAGNOSIS — Z124 Encounter for screening for malignant neoplasm of cervix: Secondary | ICD-10-CM | POA: Diagnosis not present

## 2023-06-22 DIAGNOSIS — Z0000 Encounter for general adult medical examination without abnormal findings: Secondary | ICD-10-CM | POA: Diagnosis not present

## 2023-06-22 DIAGNOSIS — B3731 Acute candidiasis of vulva and vagina: Secondary | ICD-10-CM | POA: Diagnosis not present

## 2023-06-22 DIAGNOSIS — N898 Other specified noninflammatory disorders of vagina: Secondary | ICD-10-CM | POA: Diagnosis not present

## 2023-06-22 DIAGNOSIS — Z3202 Encounter for pregnancy test, result negative: Secondary | ICD-10-CM | POA: Diagnosis not present

## 2023-06-22 DIAGNOSIS — Z3041 Encounter for surveillance of contraceptive pills: Secondary | ICD-10-CM | POA: Diagnosis not present

## 2023-06-22 DIAGNOSIS — N871 Moderate cervical dysplasia: Secondary | ICD-10-CM | POA: Diagnosis not present

## 2023-06-22 MED ORDER — NORETHIN ACE-ETH ESTRAD-FE 1.5-30 MG-MCG PO TABS
1.0000 | ORAL_TABLET | Freq: Every day | ORAL | 4 refills | Status: DC
  Filled 2023-06-22: qty 112, 112d supply, fill #0
  Filled 2023-08-18: qty 112, 84d supply, fill #0
  Filled 2023-11-04: qty 112, 84d supply, fill #1
  Filled 2024-02-01: qty 112, 84d supply, fill #2
  Filled 2024-04-21: qty 112, 84d supply, fill #3
  Filled 2024-06-09 (×3): qty 112, 84d supply, fill #4

## 2023-06-22 MED ORDER — FLUCONAZOLE 150 MG PO TABS
150.0000 mg | ORAL_TABLET | Freq: Once | ORAL | 6 refills | Status: AC
  Filled 2023-06-22: qty 2, 3d supply, fill #0
  Filled 2023-07-21: qty 2, 3d supply, fill #1
  Filled 2023-11-09: qty 2, 3d supply, fill #2

## 2023-06-23 ENCOUNTER — Other Ambulatory Visit (HOSPITAL_COMMUNITY): Payer: Self-pay

## 2023-07-01 LAB — CYTOLOGY - PAP
Comment: NEGATIVE
Diagnosis: NEGATIVE
High risk HPV: NEGATIVE

## 2023-07-21 ENCOUNTER — Other Ambulatory Visit (HOSPITAL_COMMUNITY): Payer: Self-pay

## 2023-07-26 ENCOUNTER — Other Ambulatory Visit (HOSPITAL_COMMUNITY): Payer: Self-pay

## 2023-08-18 ENCOUNTER — Other Ambulatory Visit (HOSPITAL_COMMUNITY): Payer: Self-pay

## 2023-10-14 ENCOUNTER — Other Ambulatory Visit (HOSPITAL_COMMUNITY): Payer: Self-pay

## 2023-10-14 MED ORDER — FLUCONAZOLE 100 MG PO TABS
100.0000 mg | ORAL_TABLET | ORAL | 0 refills | Status: AC
  Filled 2023-10-14: qty 2, 3d supply, fill #0

## 2023-10-14 MED ORDER — AZITHROMYCIN 250 MG PO TABS
ORAL_TABLET | ORAL | 0 refills | Status: AC
  Filled 2023-10-14: qty 6, 5d supply, fill #0

## 2023-11-04 ENCOUNTER — Other Ambulatory Visit (HOSPITAL_COMMUNITY): Payer: Self-pay

## 2023-11-04 ENCOUNTER — Other Ambulatory Visit: Payer: Self-pay

## 2023-11-05 ENCOUNTER — Other Ambulatory Visit: Payer: Self-pay

## 2023-11-05 ENCOUNTER — Other Ambulatory Visit (HOSPITAL_COMMUNITY): Payer: Self-pay

## 2023-11-05 MED ORDER — SULFACETAMIDE SODIUM-SULFUR 10-5 % EX LIQD
Freq: Every day | CUTANEOUS | 3 refills | Status: AC
  Filled 2023-11-05: qty 340, 30d supply, fill #0
  Filled 2024-08-21: qty 340, 30d supply, fill #1
  Filled 2024-09-18: qty 340, 30d supply, fill #2
  Filled 2024-10-30: qty 340, 30d supply, fill #3

## 2023-11-05 MED ORDER — SPIRONOLACTONE 100 MG PO TABS
100.0000 mg | ORAL_TABLET | Freq: Every day | ORAL | 3 refills | Status: AC
  Filled 2023-11-05: qty 90, 90d supply, fill #0
  Filled 2024-02-01: qty 90, 90d supply, fill #1
  Filled 2024-05-22: qty 90, 90d supply, fill #2
  Filled 2024-08-21: qty 90, 90d supply, fill #3

## 2023-11-08 ENCOUNTER — Other Ambulatory Visit (HOSPITAL_COMMUNITY): Payer: Self-pay

## 2023-11-08 DIAGNOSIS — K589 Irritable bowel syndrome without diarrhea: Secondary | ICD-10-CM | POA: Diagnosis not present

## 2023-11-08 DIAGNOSIS — F419 Anxiety disorder, unspecified: Secondary | ICD-10-CM | POA: Diagnosis not present

## 2023-11-08 DIAGNOSIS — N871 Moderate cervical dysplasia: Secondary | ICD-10-CM | POA: Diagnosis not present

## 2023-11-08 DIAGNOSIS — R519 Headache, unspecified: Secondary | ICD-10-CM | POA: Diagnosis not present

## 2023-11-08 DIAGNOSIS — L709 Acne, unspecified: Secondary | ICD-10-CM | POA: Diagnosis not present

## 2023-11-08 DIAGNOSIS — J302 Other seasonal allergic rhinitis: Secondary | ICD-10-CM | POA: Diagnosis not present

## 2023-11-08 DIAGNOSIS — B977 Papillomavirus as the cause of diseases classified elsewhere: Secondary | ICD-10-CM | POA: Diagnosis not present

## 2023-11-08 DIAGNOSIS — E7800 Pure hypercholesterolemia, unspecified: Secondary | ICD-10-CM | POA: Diagnosis not present

## 2023-11-08 DIAGNOSIS — Z0000 Encounter for general adult medical examination without abnormal findings: Secondary | ICD-10-CM | POA: Diagnosis not present

## 2023-11-08 DIAGNOSIS — M542 Cervicalgia: Secondary | ICD-10-CM | POA: Diagnosis not present

## 2023-11-08 MED ORDER — DOXYCYCLINE HYCLATE 100 MG PO TABS
100.0000 mg | ORAL_TABLET | Freq: Two times a day (BID) | ORAL | 0 refills | Status: DC
  Filled 2023-11-08: qty 20, 10d supply, fill #0

## 2023-11-08 MED ORDER — HYDROXYZINE HCL 25 MG PO TABS
25.0000 mg | ORAL_TABLET | Freq: Every day | ORAL | 1 refills | Status: AC | PRN
  Filled 2023-11-08: qty 30, 30d supply, fill #0
  Filled 2023-12-24: qty 30, 30d supply, fill #1

## 2023-11-08 MED ORDER — CYCLOBENZAPRINE HCL 10 MG PO TABS
10.0000 mg | ORAL_TABLET | Freq: Every evening | ORAL | 1 refills | Status: AC | PRN
  Filled 2023-11-08: qty 30, 30d supply, fill #0
  Filled 2023-12-24: qty 30, 30d supply, fill #1

## 2023-11-09 ENCOUNTER — Other Ambulatory Visit (HOSPITAL_COMMUNITY): Payer: Self-pay

## 2023-11-09 DIAGNOSIS — L821 Other seborrheic keratosis: Secondary | ICD-10-CM | POA: Diagnosis not present

## 2023-11-09 DIAGNOSIS — D2271 Melanocytic nevi of right lower limb, including hip: Secondary | ICD-10-CM | POA: Diagnosis not present

## 2023-11-09 DIAGNOSIS — D225 Melanocytic nevi of trunk: Secondary | ICD-10-CM | POA: Diagnosis not present

## 2023-11-09 DIAGNOSIS — D2261 Melanocytic nevi of right upper limb, including shoulder: Secondary | ICD-10-CM | POA: Diagnosis not present

## 2023-11-09 DIAGNOSIS — L814 Other melanin hyperpigmentation: Secondary | ICD-10-CM | POA: Diagnosis not present

## 2023-11-09 DIAGNOSIS — L700 Acne vulgaris: Secondary | ICD-10-CM | POA: Diagnosis not present

## 2023-11-11 ENCOUNTER — Other Ambulatory Visit (HOSPITAL_COMMUNITY): Payer: Self-pay

## 2023-11-11 MED ORDER — TRETINOIN 0.025 % EX CREA
1.0000 | TOPICAL_CREAM | Freq: Every evening | CUTANEOUS | 3 refills | Status: AC
  Filled 2023-11-11: qty 45, 90d supply, fill #0

## 2024-06-09 ENCOUNTER — Telehealth: Admitting: Physician Assistant

## 2024-06-09 ENCOUNTER — Other Ambulatory Visit (HOSPITAL_COMMUNITY): Payer: Self-pay

## 2024-06-09 DIAGNOSIS — B9689 Other specified bacterial agents as the cause of diseases classified elsewhere: Secondary | ICD-10-CM

## 2024-06-09 DIAGNOSIS — J019 Acute sinusitis, unspecified: Secondary | ICD-10-CM | POA: Diagnosis not present

## 2024-06-09 MED ORDER — DOXYCYCLINE HYCLATE 100 MG PO TABS
100.0000 mg | ORAL_TABLET | Freq: Two times a day (BID) | ORAL | 0 refills | Status: AC
  Filled 2024-06-09: qty 20, 10d supply, fill #0

## 2024-06-09 NOTE — Progress Notes (Signed)

## 2024-06-15 ENCOUNTER — Other Ambulatory Visit (HOSPITAL_COMMUNITY): Payer: Self-pay

## 2024-06-15 ENCOUNTER — Telehealth: Admitting: Physician Assistant

## 2024-06-15 DIAGNOSIS — T3695XA Adverse effect of unspecified systemic antibiotic, initial encounter: Secondary | ICD-10-CM

## 2024-06-15 MED ORDER — FLUCONAZOLE 150 MG PO TABS
150.0000 mg | ORAL_TABLET | ORAL | 0 refills | Status: AC
  Filled 2024-06-15: qty 2, 3d supply, fill #0

## 2024-06-15 NOTE — Progress Notes (Signed)

## 2024-06-15 NOTE — Progress Notes (Signed)
 I have spent 5 minutes in review of e-visit questionnaire, review and updating patient chart, medical decision making and response to patient.   Elsie Velma Lunger, PA-C

## 2024-06-27 ENCOUNTER — Other Ambulatory Visit (HOSPITAL_COMMUNITY)
Admission: RE | Admit: 2024-06-27 | Discharge: 2024-06-27 | Disposition: A | Discharge status: Home / Self Care | Source: Ambulatory Visit | Attending: Obstetrics and Gynecology | Admitting: Obstetrics and Gynecology

## 2024-06-27 ENCOUNTER — Other Ambulatory Visit (HOSPITAL_COMMUNITY): Payer: Self-pay

## 2024-06-27 ENCOUNTER — Other Ambulatory Visit: Payer: Self-pay | Admitting: Obstetrics and Gynecology

## 2024-06-27 DIAGNOSIS — Z87410 Personal history of cervical dysplasia: Secondary | ICD-10-CM | POA: Diagnosis not present

## 2024-06-27 DIAGNOSIS — Z01419 Encounter for gynecological examination (general) (routine) without abnormal findings: Secondary | ICD-10-CM | POA: Diagnosis not present

## 2024-06-27 MED ORDER — NORETHIN ACE-ETH ESTRAD-FE 1.5-30 MG-MCG PO TABS
1.0000 | ORAL_TABLET | Freq: Every day | ORAL | 3 refills | Status: AC
  Filled 2024-09-18: qty 112, 84d supply, fill #0

## 2024-06-29 LAB — CYTOLOGY - PAP
Comment: NEGATIVE
Comment: NEGATIVE
Comment: NEGATIVE
Diagnosis: UNDETERMINED — AB
HPV 16: NEGATIVE
HPV 18 / 45: NEGATIVE
High risk HPV: POSITIVE — AB

## 2024-06-30 ENCOUNTER — Other Ambulatory Visit: Payer: Self-pay | Admitting: Obstetrics and Gynecology

## 2024-06-30 DIAGNOSIS — Z1231 Encounter for screening mammogram for malignant neoplasm of breast: Secondary | ICD-10-CM

## 2024-07-03 DIAGNOSIS — J029 Acute pharyngitis, unspecified: Secondary | ICD-10-CM | POA: Diagnosis not present

## 2024-07-03 DIAGNOSIS — Z20822 Contact with and (suspected) exposure to covid-19: Secondary | ICD-10-CM | POA: Diagnosis not present

## 2024-07-24 ENCOUNTER — Other Ambulatory Visit: Payer: Self-pay | Admitting: Obstetrics and Gynecology

## 2024-07-24 DIAGNOSIS — R896 Abnormal cytological findings in specimens from other organs, systems and tissues: Secondary | ICD-10-CM | POA: Diagnosis not present

## 2024-07-24 DIAGNOSIS — N870 Mild cervical dysplasia: Secondary | ICD-10-CM | POA: Diagnosis not present

## 2024-07-24 DIAGNOSIS — Z3202 Encounter for pregnancy test, result negative: Secondary | ICD-10-CM | POA: Diagnosis not present

## 2024-07-26 LAB — SURGICAL PATHOLOGY

## 2024-08-29 ENCOUNTER — Ambulatory Visit
Admission: RE | Admit: 2024-08-29 | Discharge: 2024-08-29 | Disposition: A | Discharge status: Home / Self Care | Source: Ambulatory Visit | Attending: Internal Medicine | Admitting: Internal Medicine

## 2024-08-29 ENCOUNTER — Other Ambulatory Visit: Payer: Self-pay | Admitting: Internal Medicine

## 2024-08-29 DIAGNOSIS — H9192 Unspecified hearing loss, left ear: Secondary | ICD-10-CM | POA: Diagnosis not present

## 2024-08-29 DIAGNOSIS — J988 Other specified respiratory disorders: Secondary | ICD-10-CM

## 2024-08-29 DIAGNOSIS — J302 Other seasonal allergic rhinitis: Secondary | ICD-10-CM | POA: Diagnosis not present

## 2024-08-29 DIAGNOSIS — H9202 Otalgia, left ear: Secondary | ICD-10-CM | POA: Diagnosis not present

## 2024-08-29 DIAGNOSIS — R062 Wheezing: Secondary | ICD-10-CM | POA: Diagnosis not present

## 2024-08-29 DIAGNOSIS — R059 Cough, unspecified: Secondary | ICD-10-CM | POA: Diagnosis not present

## 2024-08-30 ENCOUNTER — Other Ambulatory Visit (HOSPITAL_COMMUNITY): Payer: Self-pay

## 2024-08-30 MED ORDER — PREDNISONE 20 MG PO TABS
ORAL_TABLET | ORAL | 0 refills | Status: AC
  Filled 2024-08-30: qty 12, 6d supply, fill #0

## 2024-09-18 ENCOUNTER — Other Ambulatory Visit (HOSPITAL_COMMUNITY): Payer: Self-pay

## 2024-09-19 ENCOUNTER — Other Ambulatory Visit (HOSPITAL_COMMUNITY): Payer: Self-pay

## 2024-10-30 ENCOUNTER — Other Ambulatory Visit: Payer: Self-pay

## 2024-10-30 ENCOUNTER — Ambulatory Visit
Admission: RE | Admit: 2024-10-30 | Discharge: 2024-10-30 | Disposition: A | Discharge status: Home / Self Care | Source: Ambulatory Visit | Attending: Obstetrics and Gynecology | Admitting: Obstetrics and Gynecology

## 2024-10-30 ENCOUNTER — Other Ambulatory Visit (HOSPITAL_COMMUNITY): Payer: Self-pay

## 2024-10-30 DIAGNOSIS — Z1231 Encounter for screening mammogram for malignant neoplasm of breast: Secondary | ICD-10-CM

## 2024-11-08 ENCOUNTER — Other Ambulatory Visit (HOSPITAL_COMMUNITY): Payer: Self-pay

## 2024-11-08 MED ORDER — CYCLOBENZAPRINE HCL 10 MG PO TABS
10.0000 mg | ORAL_TABLET | Freq: Every evening | ORAL | 1 refills | Status: AC | PRN
  Filled 2024-11-08: qty 30, 30d supply, fill #0

## 2024-11-08 MED ORDER — OSELTAMIVIR PHOSPHATE 75 MG PO CAPS
75.0000 mg | ORAL_CAPSULE | Freq: Every day | ORAL | 0 refills | Status: AC
  Filled 2024-11-08: qty 10, 10d supply, fill #0

## 2024-11-08 MED ORDER — ONDANSETRON HCL 4 MG PO TABS
4.0000 mg | ORAL_TABLET | Freq: Four times a day (QID) | ORAL | 0 refills | Status: AC | PRN
  Filled 2024-11-08: qty 15, 4d supply, fill #0

## 2024-11-08 MED ORDER — HYDROXYZINE HCL 25 MG PO TABS
25.0000 mg | ORAL_TABLET | Freq: Every day | ORAL | 1 refills | Status: AC | PRN
  Filled 2024-11-08: qty 30, 30d supply, fill #0

## 2024-11-08 MED ORDER — MONTELUKAST SODIUM 10 MG PO TABS
10.0000 mg | ORAL_TABLET | Freq: Every day | ORAL | 0 refills | Status: AC
  Filled 2024-11-08: qty 30, 30d supply, fill #0

## 2024-11-08 MED ORDER — ALBUTEROL SULFATE (2.5 MG/3ML) 0.083% IN NEBU
3.0000 mL | INHALATION_SOLUTION | Freq: Four times a day (QID) | RESPIRATORY_TRACT | 0 refills | Status: AC | PRN
  Filled 2024-11-08: qty 300, 25d supply, fill #0

## 2024-11-28 ENCOUNTER — Other Ambulatory Visit (HOSPITAL_COMMUNITY): Payer: Self-pay

## 2024-11-28 ENCOUNTER — Encounter (HOSPITAL_COMMUNITY): Payer: Self-pay
